# Patient Record
Sex: Male | Born: 1960 | Race: White | Hispanic: No | Smoking: Never smoker
Health system: Southern US, Community
[De-identification: ages and names within clinical notes are randomized; demographics above are authoritative.]

## PROBLEM LIST (undated history)

## (undated) DIAGNOSIS — K2210 Ulcer of esophagus without bleeding: Secondary | ICD-10-CM

## (undated) DIAGNOSIS — K5900 Constipation, unspecified: Secondary | ICD-10-CM

## (undated) DIAGNOSIS — K2970 Gastritis, unspecified, without bleeding: Secondary | ICD-10-CM

## (undated) DIAGNOSIS — E785 Hyperlipidemia, unspecified: Secondary | ICD-10-CM

## (undated) DIAGNOSIS — K5790 Diverticulosis of intestine, part unspecified, without perforation or abscess without bleeding: Secondary | ICD-10-CM

## (undated) DIAGNOSIS — K633 Ulcer of intestine: Secondary | ICD-10-CM

## (undated) DIAGNOSIS — K559 Vascular disorder of intestine, unspecified: Secondary | ICD-10-CM

## (undated) DIAGNOSIS — B9681 Helicobacter pylori [H. pylori] as the cause of diseases classified elsewhere: Secondary | ICD-10-CM

## (undated) DIAGNOSIS — I10 Essential (primary) hypertension: Secondary | ICD-10-CM

## (undated) DIAGNOSIS — T7840XA Allergy, unspecified, initial encounter: Secondary | ICD-10-CM

## (undated) DIAGNOSIS — K219 Gastro-esophageal reflux disease without esophagitis: Secondary | ICD-10-CM

## (undated) DIAGNOSIS — D126 Benign neoplasm of colon, unspecified: Secondary | ICD-10-CM

## (undated) HISTORY — DX: Hyperlipidemia, unspecified: E78.5

## (undated) HISTORY — DX: Gastritis, unspecified, without bleeding: K29.70

## (undated) HISTORY — DX: Allergy, unspecified, initial encounter: T78.40XA

## (undated) HISTORY — PX: NASAL SEPTUM SURGERY: SHX37

## (undated) HISTORY — DX: Essential (primary) hypertension: I10

## (undated) HISTORY — PX: COLONOSCOPY: SHX174

## (undated) HISTORY — DX: Diverticulosis of intestine, part unspecified, without perforation or abscess without bleeding: K57.90

## (undated) HISTORY — PX: LASIK: SHX215

## (undated) HISTORY — PX: POLYPECTOMY: SHX149

## (undated) HISTORY — PX: OTHER SURGICAL HISTORY: SHX169

## (undated) HISTORY — DX: Ulcer of intestine: K63.3

## (undated) HISTORY — DX: Ulcer of esophagus without bleeding: K22.10

## (undated) HISTORY — DX: Vascular disorder of intestine, unspecified: K55.9

## (undated) HISTORY — DX: Constipation, unspecified: K59.00

## (undated) HISTORY — DX: Gastro-esophageal reflux disease without esophagitis: K21.9

## (undated) HISTORY — DX: Benign neoplasm of colon, unspecified: D12.6

## (undated) HISTORY — DX: Helicobacter pylori (H. pylori) as the cause of diseases classified elsewhere: B96.81

## (undated) HISTORY — PX: UPPER GASTROINTESTINAL ENDOSCOPY: SHX188

---

## 2000-09-16 ENCOUNTER — Emergency Department (HOSPITAL_COMMUNITY): Admission: EM | Admit: 2000-09-16 | Discharge: 2000-09-17 | Payer: Self-pay | Admitting: Emergency Medicine

## 2000-09-17 ENCOUNTER — Encounter: Payer: Self-pay | Admitting: Emergency Medicine

## 2004-02-15 ENCOUNTER — Emergency Department (HOSPITAL_COMMUNITY): Admission: EM | Admit: 2004-02-15 | Discharge: 2004-02-16 | Payer: Self-pay | Admitting: Emergency Medicine

## 2009-01-04 ENCOUNTER — Emergency Department (HOSPITAL_BASED_OUTPATIENT_CLINIC_OR_DEPARTMENT_OTHER): Admission: EM | Admit: 2009-01-04 | Discharge: 2009-01-04 | Payer: Self-pay | Admitting: Emergency Medicine

## 2009-01-04 ENCOUNTER — Ambulatory Visit: Payer: Self-pay | Admitting: Interventional Radiology

## 2009-01-11 ENCOUNTER — Encounter (INDEPENDENT_AMBULATORY_CARE_PROVIDER_SITE_OTHER): Payer: Self-pay | Admitting: *Deleted

## 2009-01-11 ENCOUNTER — Encounter: Payer: Self-pay | Admitting: Gastroenterology

## 2009-02-05 DIAGNOSIS — D126 Benign neoplasm of colon, unspecified: Secondary | ICD-10-CM

## 2009-02-05 HISTORY — DX: Benign neoplasm of colon, unspecified: D12.6

## 2009-02-09 ENCOUNTER — Ambulatory Visit: Payer: Self-pay | Admitting: Gastroenterology

## 2009-02-09 ENCOUNTER — Encounter (INDEPENDENT_AMBULATORY_CARE_PROVIDER_SITE_OTHER): Payer: Self-pay | Admitting: *Deleted

## 2009-02-09 DIAGNOSIS — K5900 Constipation, unspecified: Secondary | ICD-10-CM | POA: Insufficient documentation

## 2009-02-09 DIAGNOSIS — R748 Abnormal levels of other serum enzymes: Secondary | ICD-10-CM | POA: Insufficient documentation

## 2009-02-09 DIAGNOSIS — R198 Other specified symptoms and signs involving the digestive system and abdomen: Secondary | ICD-10-CM

## 2009-02-09 DIAGNOSIS — R1012 Left upper quadrant pain: Secondary | ICD-10-CM

## 2009-02-09 DIAGNOSIS — R079 Chest pain, unspecified: Secondary | ICD-10-CM

## 2009-02-09 DIAGNOSIS — K219 Gastro-esophageal reflux disease without esophagitis: Secondary | ICD-10-CM

## 2009-02-09 LAB — CONVERTED CEMR LAB: Amylase: 84 units/L (ref 27–131)

## 2009-02-21 ENCOUNTER — Ambulatory Visit: Payer: Self-pay | Admitting: Gastroenterology

## 2009-02-24 ENCOUNTER — Encounter: Payer: Self-pay | Admitting: Gastroenterology

## 2009-03-29 ENCOUNTER — Ambulatory Visit: Payer: Self-pay | Admitting: Gastroenterology

## 2009-03-29 DIAGNOSIS — Z860100 Personal history of colon polyps, unspecified: Secondary | ICD-10-CM | POA: Insufficient documentation

## 2009-03-29 DIAGNOSIS — A048 Other specified bacterial intestinal infections: Secondary | ICD-10-CM | POA: Insufficient documentation

## 2009-12-21 ENCOUNTER — Ambulatory Visit (HOSPITAL_COMMUNITY): Admission: RE | Admit: 2009-12-21 | Discharge: 2009-12-21 | Payer: Self-pay | Admitting: General Surgery

## 2010-03-07 NOTE — Letter (Signed)
Summary: Patient Notice-Endo Biopsy Results  [facility]  [address] [place], [place] [address]   Phone: [phone]  Fax: [phone]        February 24, 2009 MRN: [id number]    Wesley Savage [address] [place], [place]  [address]    Dear [name],  I am pleased to inform you that the biopsies taken during your recent endoscopic examination did not show any evidence of cancer upon pathologic examination. The biopsies showed H. pylori gastritis. Please complete the antibiotic as prescribed.  Continue with the treatment plan as outlined on the day of your      exam.  Please call us if you are having persistent problems or have questions about your condition that have not been fully answered at this time.  Sincerely,  Meryl Dare MD [facility]  This letter has been electronically signed by your physician.  Appended Document: Patient Notice-Endo Biopsy Results Letter mailed 1.21.11

## 2010-03-07 NOTE — Letter (Signed)
Summary: Referral & Physician's Notes/[facility]  Referral & Physician's Notes/[facility]   Imported By: Sherian Rein 02/14/2009 08:13:39  _____________________________________________________________________  External Attachment:    Type:   Image     Comment:   External Document

## 2010-03-07 NOTE — Letter (Signed)
Summary: [facility] Instructions  [facility]  [address] [place], [place] [address]   Phone: [phone]  Fax: [phone]       RALLY OUCH    03-Mar-1960    MRN: [id number]        Procedure Day /Date: Monday January 17th, 2011     Arrival Time: 2:30pm     Procedure Time: 3:30pm     Location of Procedure:                    _ x_  [facility] (4th Floor)                        PREPARATION FOR COLONOSCOPY WITH MOVIPREP   Starting 5 days prior to your procedure 02/16/09 do not eat nuts, seeds, popcorn, corn, beans, peas,  salads, or any raw vegetables.  Do not take any fiber supplements (e.g. Metamucil, Citrucel, and Benefiber).  THE DAY BEFORE YOUR PROCEDURE         DATE:  02/20/09   DAY:  Sunday   1.  Drink clear liquids the entire day-NO SOLID FOOD  2.  Do not drink anything colored red or purple.  Avoid juices with pulp.  No orange juice.  3.  Drink at least 64 oz. (8 glasses) of fluid/clear liquids during the day to prevent dehydration and help the prep work efficiently.  CLEAR LIQUIDS INCLUDE: Water Jello Ice Popsicles Tea (sugar ok, no milk/cream) Powdered fruit flavored drinks Coffee (sugar ok, no milk/cream) Gatorade Juice: apple, white grape, white cranberry  Lemonade Clear bullion, consomm, broth Carbonated beverages (any kind) Strained chicken noodle soup Hard Candy                             4.  In the morning, mix first dose of MoviPrep solution:    Empty 1 Pouch A and 1 Pouch B into the disposable container    Add lukewarm drinking water to the top line of the container. Mix to dissolve    Refrigerate (mixed solution should be used within 24 hrs)  5.  Begin drinking the prep at 5:00 p.m. The MoviPrep container is divided by 4 marks.   Every 15 minutes drink the solution down to the next mark (approximately 8 oz) until the full liter is complete.   6.  Follow completed prep with 16 oz of clear liquid of your  choice (Nothing red or purple).  Continue to drink clear liquids until bedtime.  7.  Before going to bed, mix second dose of MoviPrep solution:    Empty 1 Pouch A and 1 Pouch B into the disposable container    Add lukewarm drinking water to the top line of the container. Mix to dissolve    Refrigerate  THE DAY OF YOUR PROCEDURE      DATE:  02/21/09  DAY:  Monday  Beginning at  10:30a.m. (5 hours before procedure):         1. Every 15 minutes, drink the solution down to the next mark (approx 8 oz) until the full liter is complete.  2. Follow completed prep with 16 oz. of clear liquid of your choice.    3. You may drink clear liquids until  1:30pm  (2 HOURS BEFORE PROCEDURE).   MEDICATION INSTRUCTIONS  Unless otherwise instructed, you should take regular prescription medications with a small sip of water  as early as possible the morning of your procedure.         OTHER INSTRUCTIONS  You will need a responsible adult at least 48 years of age to accompany you and drive you home.   This person must remain in the waiting room during your procedure.  Wear loose fitting clothing that is easily removed.  Leave jewelry and other valuables at home.  However, you may wish to bring a book to read or  an iPod/MP3 player to listen to music as you wait for your procedure to start.  Remove all body piercing jewelry and leave at home.  Total time from sign-in until discharge is approximately 2-3 hours.  You should go home directly after your procedure and rest.  You can resume normal activities the  day after your procedure.  The day of your procedure you should not:   Drive   Make legal decisions   Operate machinery   Drink alcohol   Return to work  You will receive specific instructions about eating, activities and medications before you leave.    The above instructions have been reviewed and explained to me by   Marchelle Folks.    I fully understand and can verbalize  these instructions _____________________________ Date _________

## 2010-03-07 NOTE — Miscellaneous (Signed)
Summary: omeprazole order  Clinical Lists Changes  Medications: Added new medication of OMEPRAZOLE 20 MG  CPDR (OMEPRAZOLE) 1 each day 30 minutes before meal - Signed Rx of OMEPRAZOLE 20 MG  CPDR (OMEPRAZOLE) 1 each day 30 minutes before meal;  #34 x 11;  Signed;  Entered by: Alease Frame RN;  Authorized by: Meryl Dare MD [facility];  Method used: Electronically to [facility] [phone]*, [address] [place], [place]  [address], Ph: [phone], Fax: [phone] Observations: Added new observation of ALLERGY REV: Done (02/22/2009 8:01)    Prescriptions: OMEPRAZOLE 20 MG  CPDR (OMEPRAZOLE) 1 each day 30 minutes before meal  #34 x 11   Entered by:   Alease Frame RN   Authorized by:   Meryl Dare MD [facility]   Signed by:   Alease Frame RN on 02/22/2009   Method used:   Electronically to        [facility] [phone]* (retail)       [address]       [place], [place]  [address]       Ph: [phone]       Fax: [phone]   RxID:   [id number]

## 2010-03-07 NOTE — Letter (Signed)
Summary: Patient Notice- Polyp Results  [facility]  [address] [place], [place] [address]   Phone: [phone]  Fax: [phone]        February 24, 2009 MRN: [id number]    Wesley Savage [address] [place], [place]  [address]    Dear Wesley Savage,  I am pleased to inform you that the colon polyp(s) removed during your recent colonoscopy was (were) found to be benign (no cancer detected) upon pathologic examination.  I recommend you have a repeat colonoscopy examination in 5 years to look for recurrent polyps, as having colon polyps increases your risk for having recurrent polyps or even colon cancer in the future.  Should you develop new or worsening symptoms of abdominal pain, bowel habit changes or bleeding from the rectum or bowels, please schedule an evaluation with either your primary care physician or with me.  Continue treatment plan as outlined the day of your exam.  Please call us if you are having persistent problems or have questions about your condition that have not been fully answered at this time.  Sincerely,  Meryl Dare MD [facility]  This letter has been electronically signed by your physician.  Appended Document: Patient Notice- Polyp Results Letter mailed 1.21.11

## 2010-03-07 NOTE — Assessment & Plan Note (Signed)
Summary: follow up endo/colon/sheri   History of Present Illness Visit Type: Follow-up Visit Primary GI MD: Elie Goody MD [facility] Primary Provider: Tracey Harries, MD  Requesting Provider: Tracey Harries, MD Chief Complaint: follow-up endo/colon  pt. denies any problems or complaints at this time History of Present Illness:   Wesley Savage has had very good control of his reflux symptoms on omeprazole once daily. He was treated for Helicobacter pylori gastritis. He has no gastrointestinal complaints. He denies chest pain. I reviewed the results of his endoscopy, colonoscopy and biopsies.   GI Review of Systems      Denies abdominal pain, acid reflux, belching, bloating, chest pain, dysphagia with liquids, dysphagia with solids, heartburn, loss of appetite, nausea, vomiting, vomiting blood, weight loss, and  weight gain.        Denies anal fissure, black tarry stools, change in bowel habit, constipation, diarrhea, diverticulosis, fecal incontinence, heme positive stool, hemorrhoids, irritable bowel syndrome, jaundice, light color stool, liver problems, rectal bleeding, and  rectal pain.   Current Medications (verified): 1)  Metoprolol Tartrate 50 Mg Tabs (Metoprolol Tartrate) .... One Tablet By Mouth Two Times A Day 2)  Aspirin 81 Mg  Tabs (Aspirin) .... One Tablet By Mouth Once Daily 3)  Losartan Potassium 50 Mg Tabs (Losartan Potassium) .... One Tablet By Mouth Once Daily 4)  Omeprazole 20 Mg  Cpdr (Omeprazole) .Marland Kitchen.. 1 Each Day 30 Minutes Before Meal  Allergies (verified): 1)  ! * Lisinopril  Past History:  Past Medical History: Diverticulitis Hyperlipidemia GERD Hypertension H. Pylori gastritis-treated, 02/2009 Adenomatous colon polyp, 02/2009  Past Surgical History: Left arm surgery  Deviated Septum Repair Lasik   Family History: Reviewed history from 02/09/2009 and no changes required. Mother: HTN, RA Brother: 3 brother w/ asthma Father: D @ 72. CAD MI @ 54. Prostate  CA @ 67. No FH of Colon Cancer: Family History of Colon Polyps:Mother  Family History of Kidney Disease:MGF  Social History: Reviewed history from 02/09/2009 and no changes required. Patient has never smoked.  Alcohol Use no Illicit Drug Use - no Occupation:Credit Manager  Divorced No children  Review of Systems       The pertinent positives and negatives are noted as above and in the HPI. All other ROS were reviewed and were negative.   Vital Signs:  Patient profile:   50 year old male Height:      70 inches Weight:      222.25 pounds BMI:     32.00 Pulse rate:   64 / minute Pulse rhythm:   regular BP sitting:   122 / 82  (left arm)  Vitals Entered By: Milford Cage NCMA (March 29, 2009 9:29 AM)  Physical Exam  General:  Well developed, well nourished, no acute distress. Head:  Normocephalic and atraumatic. Eyes:  PERRLA, no icterus. Mouth:  No deformity or lesions, dentition normal. Lungs:  Clear throughout to auscultation. Heart:  Regular rate and rhythm; no murmurs, rubs,  or bruits. Abdomen:  Soft, nontender and nondistended. No masses, hepatosplenomegaly or hernias noted. Normal bowel sounds. Psych:  Alert and cooperative. Normal mood and affect.  Impression & Recommendations:  Problem # 1:  GERD (ICD-530.81) Check continue standard antireflux measures and omeprazole 20 mg q.a.m. After another 2 months of treatment, he may try omeprazole on an as-needed basis.  Problem # 2:  HELICOBACTER PYLORI INFECTION (ICD-041.86) Recently treated Helicobacter pylori gastritis.  Problem # 3:  PERSONAL HX COLONIC POLYPS (ICD-V12.72) Adenomatous colon polyps. Surveillance colonoscopy  recommended January 2016.  Patient Instructions: 1)  Avoid foods high in acid content ( tomatoes, citrus juices, spicy foods) . Avoid eating within 3 to 4 hours of lying down or before exercising. Do not over eat; try smaller more frequent meals. Elevate head of bed four inches when  sleeping.  2)  Please schedule a follow-up appointment as needed.  3)  Copy sent to : Tracey Harries, MD 4)  The medication list was reviewed and reconciled.  All changed / newly prescribed medications were explained.  A complete medication list was provided to the patient / caregiver.

## 2010-03-07 NOTE — Procedures (Signed)
Summary: Upper Endoscopy  Patient: Wesley Savage Note: All result statuses are Final unless otherwise noted.  Tests: (1) Upper Endoscopy (EGD)   EGD Upper Endoscopy       DONE     [facility]     [address]     [place], [place]  [address]           ENDOSCOPY PROCEDURE REPORT           PATIENT:  Wesley Savage, Wesley Savage  MR#:  [id number]     BIRTHDATE:  Nov 11, 1960, 48 yrs. old  GENDER:  male           ENDOSCOPIST:  Judie Petit T. Russella Dar, MD, [facility]     Referred by:  Tracey Harries, M.D.           PROCEDURE DATE:  02/21/2009     PROCEDURE:  EGD with biopsy     ASA CLASS:  Class II     INDICATIONS:  abdominal pain, left upper quadr, GERD, chest pain                 MEDICATIONS:  There was residual sedation effect present from     prior procedure, Fentanyl 25 mcg IV, Versed 3 mg IV     TOPICAL ANESTHETIC:  Exactacain Spray           DESCRIPTION OF PROCEDURE:   After the risks benefits and     alternatives of the procedure were thoroughly explained, informed     consent was obtained.  The [facility] [id number] endoscope was     introduced through the mouth and advanced to the second portion of     the duodenum, without limitations.  The instrument was slowly     withdrawn as the mucosa was fully examined.     <<PROCEDUREIMAGES>>           Esophagitis was found in the distal esophagus. It was erosive. LA     Classification Grade A. Mild gastritis was found in the antrum. It     was erosive and erythematous. Multiple biopsies were obtained and     sent to pathology. Duodenitis was found in the bulb of the     duodenum. It was erosive and erythematous. Multiple biopsies were     obtained and sent to pathology.  Otherwise the examination was     normal. Retroflexed views revealed no abnormalities. The scope was     then withdrawn from the patient and the procedure completed.           COMPLICATIONS:  None           ENDOSCOPIC IMPRESSION:     1) Erosive esophagitis     2) Mild  gastritis in the antrum     3) Duodenitis in the bulb of duodenum           RECOMMENDATIONS:     1) await pathology results     2) anti-reflux regimen     3) PPI qam, omeprazole 20mg  po qam, #34, 11 refills     4) Call office next 2-3 days to schedule an office appointment     for 4 weeks           Suetta Hoffmeister T. Russella Dar, MD, [facility]           n.     eSIGNED:   Venita Lick. Salina Stanfield at 02/21/2009 04:05 PM           Marisa Severin, [id number]  Note: An exclamation mark (!) indicates a result that was not dispersed into the flowsheet. Document Creation Date: 02/21/2009 4:05 PM _______________________________________________________________________  (1) Order result status: Final Collection or observation date-time: 02/21/2009 16:00 Requested date-time:  Receipt date-time:  Reported date-time:  Referring Physician:   Ordering Physician: Claudette Head [phone]) Specimen Source:  Source: Launa Grill Order Number: [id number] Lab site:

## 2010-03-07 NOTE — Procedures (Signed)
Summary: Colonoscopy  Patient: Wesley Savage Note: All result statuses are Final unless otherwise noted.  Tests: (1) Colonoscopy (COL)   COL Colonoscopy           DONE     [facility]     [address]     [place], [place]  [address]           COLONOSCOPY PROCEDURE REPORT           PATIENT:  Wesley Savage, Wesley Savage  MR#:  [id number]     BIRTHDATE:  Feb 18, 1960, 48 yrs. old  GENDER:  male           ENDOSCOPIST:  Judie Petit T. Russella Dar, MD, [facility]     Referred by:  Tracey Harries, M.D.           PROCEDURE DATE:  02/21/2009     PROCEDURE:  Colonoscopy with biopsy     ASA CLASS:  Class II     INDICATIONS:  1) change in bowel habits           MEDICATIONS:   Fentanyl 75 mcg IV, Versed 7 mg IV           DESCRIPTION OF PROCEDURE:   After the risks benefits and     alternatives of the procedure were thoroughly explained, informed     consent was obtained.  Digital rectal exam was performed and     revealed no abnormalities.   The [facility] [id number] endoscope     was introduced through the anus and advanced to the cecum, which     was identified by both the appendix and ileocecal valve, without     limitations.  The quality of the prep was excellent, using     MoviPrep.  The instrument was then slowly withdrawn as the colon     was fully examined.     <<PROCEDUREIMAGES>>           FINDINGS:  A sessile polyp was found in the ascending colon. It     was 4 mm in size. The polyp was removed using cold biopsy forceps.     Mild diverticulosis was found in the descending colon. This was     otherwise a normal examination of the colon.  Retroflexed views in     the rectum revealed no abnormalities.  The time to cecum =  3     minutes. The scope was then withdrawn (time =  9  min) from the     patient and the procedure completed.           COMPLICATIONS:  None           ENDOSCOPIC IMPRESSION:     1) 4 mm sessile polyp in the ascending colon     2) Mild diverticulosis in the descending  colon           RECOMMENDATIONS:     1) Await pathology results     2) If the polyp removed today is adenomatous (pre-cancerous),     you will need a repeat colonoscopy in 5 years. Otherwise you     should continue to follow colorectal cancer screening guidelines     for "routine risk" patients with colonoscopy in 10 years.    3)     high fiber diet           Malcolm T. Russella Dar, MD, [facility]           n.     eSIGNED:  Venita Lick. Stark at 02/21/2009 03:56 PM           Marisa Severin, [id number]  Note: An exclamation mark (!) indicates a result that was not dispersed into the flowsheet. Document Creation Date: 02/21/2009 3:56 PM _______________________________________________________________________  (1) Order result status: Final Collection or observation date-time: 02/21/2009 15:51 Requested date-time:  Receipt date-time:  Reported date-time:  Referring Physician:   Ordering Physician: Claudette Head [phone]) Specimen Source:  Source: Launa Grill Order Number: [id number] Lab site:   Appended Document: Colonoscopy     Procedures Next Due Date:    Colonoscopy: 02/2014

## 2010-03-07 NOTE — Miscellaneous (Signed)
Summary: omeprazole order  Clinical Lists Changes  Medications: Added new medication of OMEPRAZOLE 20 MG  CPDR (OMEPRAZOLE) 1 each day 30 minutes before meal

## 2010-03-07 NOTE — Assessment & Plan Note (Signed)
Summary: CONSTIPATION...AS.   History of Present Illness Visit Type: consult  Primary GI MD: Elie Goody MD [facility] Primary Provider: Tracey Harries, MD  Requesting Provider: Tracey Harries, MD Chief Complaint: constipation, acid reflux, chest pain, and heartburn  History of Present Illness:   This is a 50 year old male, who has multiple complaints. He noted a change in bowel habits with thinner and smaller stools and difficulty evacuating stools followed by occasional diarrhea beginning in late November. He also noted burning substernal chest pain in late November, which responded to a course of Prilosec.  He has had intermittent sharp left, center chest pains, and left costochondral margin pain that appears to be unrelated to any digestive or other activity. These symptoms have occurred intermittently and unpredictably for the past several weeks. He was seen in [facility] emergency room on November 30. Plain films of the chest and abdomen were unremarkable. CT scan of the abdomen and pelvis with contrast showed a nonspecific right adrenal nodule and no other abnormalities. CBC was normal. Basic metabolic panel normal lipase mildly elevated at 413 with an upper limit of normal of 300.   GI Review of Systems    Reports abdominal pain, acid reflux, chest pain, heartburn, and  loss of appetite.     Location of  Abdominal pain: LUQ.    Denies belching, bloating, dysphagia with liquids, dysphagia with solids, nausea, vomiting, vomiting blood, weight loss, and  weight gain.      Reports constipation.     Denies anal fissure, black tarry stools, change in bowel habit, diarrhea, diverticulosis, fecal incontinence, heme positive stool, hemorrhoids, irritable bowel syndrome, jaundice, light color stool, liver problems, rectal bleeding, and  rectal pain.   Current Medications (verified): 1)  Metoprolol Tartrate 50 Mg Tabs (Metoprolol Tartrate) .... One Tablet By Mouth Two Times A Day 2)   Aspirin 81 Mg  Tabs (Aspirin) .... One Tablet By Mouth Once Daily 3)  Losartan Potassium 50 Mg Tabs (Losartan Potassium) .... One Tablet By Mouth Once Daily  Allergies (verified): 1)  ! * Lisinopril  Past History:  Past Medical History: Reviewed history from 02/08/2009 and no changes required. Diverticulitis Hyperlipidemia GERD Hypertension  Past Surgical History: Left arm surgery   Family History: Mother: HTN, RA Brother: 3 brother w/ asthma Father: D @ 84. CAD MI @ 54. Prostate CA @ 67. No FH of Colon Cancer: Family History of Colon Polyps:Mother  Family History of Kidney Disease:MGF  Social History: Patient has never smoked.  Alcohol Use no Illicit Drug Use - no Occupation:Credit Manager  Divorced No childern  Review of Systems       The patient complains of back pain, change in vision, fatigue, headaches-new, sleeping problems, and urination - excessive.         The pertinent positives and negatives are noted as above and in the HPI. All other ROS were reviewed and were negative.   Vital Signs:  Patient profile:   50 year old male Height:      70 inches Weight:      219 pounds BMI:     31.54 BSA:     2.17 Pulse rate:   72 / minute Pulse rhythm:   regular BP sitting:   128 / 82  (left arm) Cuff size:   regular  Vitals Entered By: Ok Anis CMA (February 09, 2009 9:59 AM)  Physical Exam  General:  Well developed, well nourished, no acute distress. Head:  Normocephalic and atraumatic.  Eyes:  PERRLA, no icterus. Ears:  Normal auditory acuity. Mouth:  No deformity or lesions, dentition normal. Neck:  Supple; no masses or thyromegaly. Chest Wall:  Symmetrical,  no deformities . no tendersness along sternum or left costochondral margin Lungs:  Clear throughout to auscultation. Heart:  Regular rate and rhythm; no murmurs, rubs,  or bruits. Abdomen:  Soft, nontender and nondistended. No masses, hepatosplenomegaly or hernias noted. Normal bowel  sounds. Rectal:  deferred until time of colonoscopy.   Msk:  Symmetrical with no gross deformities. Normal posture. Pulses:  Normal pulses noted. Extremities:  No clubbing, cyanosis, edema or deformities noted. Neurologic:  Alert and  oriented x4;  grossly normal neurologically. Cervical Nodes:  No significant cervical adenopathy. Inguinal Nodes:  No significant inguinal adenopathy. Psych:  Alert and cooperative. Normal mood and affect.  Impression & Recommendations:  Problem # 1:  CHANGE IN BOWELS (ICD-787.99) Initially with constipation, now with alternating diarrhea and constipation Rule out colorectal neoplasms and IBD. The risks, benefits and alternatives to colonoscopy with possible biopsy and possible polypectomy were discussed with the patient and they consent to proceed. The procedure will be scheduled electively. Orders: Colon/Endo (Colon/Endo)  Problem # 2:  OTHER NONSPECIFIC ABNORMAL SERUM ENZYME LEVELS (ICD-790.5) Nonspecific elevation of lipase without symptoms consistent with pancreatitis for any CT abnormalities of the pancreas. As his likely nonspecific. Obtain lipase and amylase. Orders: TLB-Amylase (82150-AMYL) TLB-Lipase (83690-LIPASE)  Problem # 3:  GERD (ICD-530.81) This may be the etiology of his chest pain, however, features of the pain are more typical of a nongastrointestinal cause. The risks, benefits and alternatives to endoscopy with possible biopsy and possible dilation were discussed with the patient and they consent to proceed. The procedure will be scheduled electively. Orders: Colon/Endo (Colon/Endo)  Problem # 4:  ABDOMINAL PAIN, LEFT UPPER QUADRANT (ICD-789.02) EGD as above. Orders: Colon/Endo (Colon/Endo)  Problem # 5:  CHEST PAIN (ICD-786.50) EGD as above.  Patient Instructions: 1)  Colonoscopy brochure given.  2)  Conscious Sedation brochure given.  3)  Upper Endoscopy brochure given.  4)  Avoid foods high in acid content ( tomatoes,  citrus juices, spicy foods) . Avoid eating within 3 to 4 hours of lying down or before exercising. Do not over eat; try smaller more frequent meals. Elevate head of bed four inches when sleeping.  5)  High Fiber, Low Fat  Healthy Eating Plan brochure given.  6)  Copy sent to : Tracey Harries, MD 7)  The medication list was reviewed and reconciled.  All changed / newly prescribed medications were explained.  A complete medication list was provided to the patient / caregiver.  Prescriptions: MOVIPREP 100 GM  SOLR (PEG-KCL-NACL-NASULF-NA ASC-C) As per prep instructions.  #1 x 0   Entered by:   Christie Nottingham CMA (AAMA)   Authorized by:   Meryl Dare MD [facility]   Signed by:   Meryl Dare MD [facility] on 02/09/2009   Method used:   Electronically to        [facility] [phone] (retail)       [address]       [place], [place]  [address]       Ph: [phone]       Fax: [phone]   RxID:   [id number]

## 2010-04-18 LAB — SURGICAL PCR SCREEN: MRSA, PCR: NEGATIVE

## 2010-04-18 LAB — BASIC METABOLIC PANEL
BUN: 23 mg/dL (ref 6–23)
CO2: 29 mEq/L (ref 19–32)
Chloride: 103 mEq/L (ref 96–112)
Creatinine, Ser: 1.17 mg/dL (ref 0.4–1.5)
GFR calc Af Amer: >60 mL/min (ref >60)
Potassium: 4.4 mEq/L (ref 3.5–5.1)

## 2010-05-10 LAB — DIFFERENTIAL
Basophils Relative: 0 % (ref 0–1)
Eosinophils Absolute: 0.1 10*3/uL (ref 0.0–0.7)
Eosinophils Relative: 1 % (ref 0–5)
Lymphs Abs: 1.6 10*3/uL (ref 0.7–4.0)
Monocytes Absolute: 0.7 10*3/uL (ref 0.1–1.0)
Monocytes Relative: 9 % (ref 3–12)

## 2010-05-10 LAB — LIPASE, BLOOD: Lipase: 413 U/L — ABNORMAL HIGH (ref 23–300)

## 2010-05-10 LAB — CBC
HCT: 45.4 % (ref 39.0–52.0)
Hemoglobin: 16 g/dL (ref 13.0–17.0)
MCHC: 35.2 g/dL (ref 30.0–36.0)
MCV: 88.9 fL (ref 78.0–100.0)
RBC: 5.11 MIL/uL (ref 4.22–5.81)
WBC: 8.1 10*3/uL (ref 4.0–10.5)

## 2010-05-10 LAB — BASIC METABOLIC PANEL
CO2: 27 mEq/L (ref 19–32)
Chloride: 101 mEq/L (ref 96–112)
GFR calc Af Amer: >60 mL/min (ref >60)
Potassium: 4.1 mEq/L (ref 3.5–5.1)

## 2014-02-08 ENCOUNTER — Telehealth: Payer: Self-pay | Admitting: Gastroenterology

## 2014-02-08 NOTE — Telephone Encounter (Signed)
Patient with constipation,  He is very concerned about taking Miralax that was recommended by his primary care.  He will come in and [employer] Hvozdovic, PA on 02/10/14 1:15

## 2014-02-10 ENCOUNTER — Encounter: Payer: Self-pay | Admitting: Physician Assistant

## 2014-02-10 ENCOUNTER — Other Ambulatory Visit (INDEPENDENT_AMBULATORY_CARE_PROVIDER_SITE_OTHER): Payer: BLUE CROSS/BLUE SHIELD

## 2014-02-10 ENCOUNTER — Ambulatory Visit (INDEPENDENT_AMBULATORY_CARE_PROVIDER_SITE_OTHER): Payer: BLUE CROSS/BLUE SHIELD | Admitting: Physician Assistant

## 2014-02-10 VITALS — BP 110/80 | HR 64 | Ht 70.0 in | Wt 215.0 lb

## 2014-02-10 DIAGNOSIS — R194 Change in bowel habit: Secondary | ICD-10-CM

## 2014-02-10 DIAGNOSIS — K5900 Constipation, unspecified: Secondary | ICD-10-CM

## 2014-02-10 DIAGNOSIS — Z86010 Personal history of colonic polyps: Secondary | ICD-10-CM

## 2014-02-10 LAB — TSH: TSH: 1.54 u[IU]/mL (ref 0.35–4.50)

## 2014-02-10 MED ORDER — PEG-KCL-NACL-NASULF-NA ASC-C 100 G PO SOLR
1.0000 | Freq: Once | ORAL | Status: DC
Start: 2014-02-10 — End: 2014-02-23

## 2014-02-10 NOTE — Patient Instructions (Signed)
The PA has requested that you go to the basement for the following lab work before leaving today:TSH.  Cecille Rubin recommends that you complete a bowel purge (to clean out your bowels). Please do the following: Purchase a bottle of Miralax over the counter as well as a box of 5 mg dulcolax tablets. Take 4 dulcolax tablets. Wait 1 hour. You will then drink 6-8 capfuls of Miralax mixed in an adequate amount of water/juice/gatorade (you may choose which of these liquids to drink) over the next 2-3 hours. You should expect results within 1 to 6 hours after completing the bowel purge.  You have been scheduled for a colonoscopy. Please follow written instructions given to you at your visit today.  Please pick up your prep kit at the pharmacy within the next 1-3 days. If you use inhalers (even only as needed), please bring them with you on the day of your procedure.  Start over the [employer] one heaping teaspoon daily.  Increase your water intake and decrease your caffeine intake.   Start eating "p" fruits such as peaches, pears, prunes, plums, pineapple and pear juice.   cc: Bernerd Limbo, MD

## 2014-02-10 NOTE — Progress Notes (Signed)
Patient ID: Wesley Savage, male   DOB: February 18, 1960, 54 y.o.   MRN: [id number]    HPI:  Wesley Savage is a 54 year old male referred for evaluation by Dr. Bartholomew Crews, do to a recent change in bowel habits.    Wesley Savage says that up until Thanksgiving, he was having a normal bowel movement on a daily basis. Around Thanksgiving he had 3 days of very large formed bowel movements, and then skipped 3 days without a bowel movement at all. Since that time, he has been skipping days between bowel movements and on the days that he does have a bowel movement, he passes only small nuggets like rabbit pellets. He has been having left lower abdominal and suprapubic crampy abdominal pain relieved with defecation, and he feels more gassy and bloated than normal. He was seen by his primary care physician on December 22 told he was constipated he was advised to use Mira lax which provided little relief he went back to his doctor's office last week and was sent for an abdominal CT that revealed constipation and some left sided diverticular disease but no diverticulitis. He tried using more Mira lax but failed to have a bowel movement. He has had no bright red blood per rectum or melena. His appetite is good but he has lost 10 pounds in the past month.  Review of his chart also shows that he had a colonoscopy by Dr. Fuller Plan in January 2011 at which time an adenomatous polyp was removed. He was advised to have surveillance in 5 years. He is interested in scheduling that at this time as well.  Past Medical History  Diagnosis Date  . Erosive esophagitis   . Helicobacter pylori gastritis   . Diverticulosis   . Tubular adenoma of colon 2011  . Hyperlipidemia   . GERD (gastroesophageal reflux disease)   . Hypertension   . Constipation     Past Surgical History  Procedure Laterality Date  . Arm surgery    . Nasal septum surgery    . Lasik     Family History  Problem Relation Age of Onset  . Hypertension Mother   .  Asthma Brother   . CAD Father   . Heart attack Father   . Prostate cancer Father   . Colon polyps Mother   . Kidney disease Maternal Grandfather    History  Substance Use Topics  . Smoking status: Never Smoker   . Smokeless tobacco: Never Used  . Alcohol Use: No   Current Outpatient Prescriptions  Medication Sig Dispense Refill  . aspirin 81 MG tablet Take 81 mg by mouth daily.    Marland Kitchen losartan (COZAAR) 50 MG tablet Take 50 mg by mouth daily.    . metoprolol (LOPRESSOR) 50 MG tablet Take 50 mg by mouth 2 (two) times daily.    . Omega-3 Fatty Acids (FISH OIL) 1200 MG CAPS Take 1 capsule by mouth 2 (two) times daily.    . tamsulosin (FLOMAX) 0.4 MG CAPS capsule Take 0.4 mg by mouth daily.    Marland Kitchen loratadine (CLARITIN) 10 MG tablet Take 10 mg by mouth daily.    . predniSONE (DELTASONE) 10 MG tablet 10 mg. Take as directed     No current facility-administered medications for this visit.   Allergies  Allergen Reactions  . Lisinopril     REACTION: Cough     Review of Systems: Gen: Denies any fever, chills, sweats, anorexia, fatigue, weakness, malaise, weight loss, and sleep disorder CV: Denies chest  pain, angina, palpitations, syncope, orthopnea, PND, peripheral edema, and claudication. Resp: Denies dyspnea at rest, dyspnea with exercise, cough, sputum, wheezing, coughing up blood, and pleurisy. GI: Denies vomiting blood, jaundice, and fecal incontinence.   Denies dysphagia or odynophagia. GU : Denies urinary burning, blood in urine, urinary frequency, urinary hesitancy, nocturnal urination, and urinary incontinence. MS: Denies joint pain, limitation of movement, and swelling, stiffness, low back pain, extremity pain. Denies muscle weakness, cramps, atrophy.  Derm: Denies rash, itching, dry skin, hives, moles, warts, or unhealing ulcers.  Psych: Denies depression, anxiety, memory loss, suicidal ideation, hallucinations, paranoia, and confusion. Heme: Denies bruising, bleeding, and  enlarged lymph nodes. Neuro:  Denies any headaches, dizziness, paresthesias. Endo:  Denies any problems with DM, thyroid, adrenal function  Studies:  CT of the abdomen and pelvis done on 02/03/2014 [facility] showed diverticulosis without diverticulitis. Hypodense lesion in the left kidney measures 6 x 9 mm, at site of abnormality on prior noncontrast exam. MRI with and without contrast could be considered for further evaluation, however may be difficult to accurately define due to small size. Unchanged right adrenal nodule consistent with adenoma. Probable sludge in the gallbladder.   Prior Endoscopies:    Colonoscopy 02/21/2009:  ENDOSCOPIC IMPRESSION:  1) 4 mm sessile polyp in the ascending colon  2) Mild diverticulosis in the descending colon    RECOMMENDATIONS:  1) Await pathology results  2) If the polyp removed today is adenomatous (pre-cancerous),  you will need a repeat colonoscopy in 5 years. Otherwise you  should continue to follow colorectal cancer screening guidelines  for "routine risk" patients with colonoscopy in 10 years. 3)  high fiber diet    Malcolm T. Fuller Plan, MD, [facility]  EGD 02/21/2009:  ENDOSCOPIC IMPRESSION:  1) Erosive esophagitis  2) Mild gastritis in the antrum  3) Duodenitis in the bulb of duodenum    RECOMMENDATIONS:  1) await pathology results  2) anti-reflux regimen  3) PPI qam, omeprazole 20mg  po qam, #34, 11 refills  4) Call office next 2-3 days to schedule an office appointment  for 4 weeks    Malcolm T. Fuller Plan, MD, [facility] Physical Exam: BP 110/80 mmHg  Pulse 64  Ht 5\' 10"  (1.778 m)  Wt 215 lb (97.523 kg)  BMI 30.85 kg/m2 Constitutional: Pleasant,well-developed male in no acute distress. HEENT: Normocephalic and atraumatic. Conjunctivae are normal. No scleral icterus. Neck supple.  Cardiovascular: Normal rate, regular rhythm.  Pulmonary/chest:  Effort normal and breath sounds normal. No wheezing, rales or rhonchi. Abdominal: Soft, nondistended, nontender. Bowel sounds active throughout. There are no masses palpable. No hepatomegaly. Extremities: no edema Lymphadenopathy: No cervical adenopathy noted. Neurological: Alert and oriented to person place and time. Skin: Skin is warm and dry. No rashes noted. Psychiatric: Normal mood and affect. Behavior is normal.  ASSESSMENT AND PLAN: #1. Change in bowel habits/constipation. Patient has been advised to use a Mira lax and Gatorade purge at home tonight. He has been advised to increase fiber in his diet and to use Benefiber 1 heaping tablespoon daily. He will also add "P fruits"-peaches, pears, plums, prunes, pineapple, NP or juice to his diet on a daily basis. He will decrease caffeine intake and increase his water intake. He has been instructed to call us if he fails to have any relief in a few days. A TSH will also be checked today.  #2. Personal history of adenomatous colon polyps. Patient has been advised to undergo colonoscopy to evaluate for recurrent polyps or neoplasia.The risks, benefits,  and alternatives to colonoscopy with possible biopsy and possible polypectomy were discussed with the patient and they consent to proceed. The procedure will be scheduled with Dr. Fuller Plan. Further recommendations will be made pending the findings of his colonoscopy.    Jennavecia Schwier, Vita Barley PA-C 02/10/2014, 2:31 PM

## 2014-02-10 NOTE — Progress Notes (Signed)
Reviewed and agree with management plan.  Malcolm T. Stark, MD FACG 

## 2014-02-12 ENCOUNTER — Telehealth: Payer: Self-pay | Admitting: Physician Assistant

## 2014-02-12 NOTE — Telephone Encounter (Signed)
Patient notified of recommendations. 

## 2014-02-12 NOTE — Telephone Encounter (Signed)
Please have pt try bisacodyl 4 tabs today and a fleets enema.

## 2014-02-12 NOTE — Telephone Encounter (Signed)
Patient reports he did the bowel purge. He still has left side discomfort. States he did the purge on Wednesday and had 4 liquid only stools. Yesterday, he had one liquid stool and no stools today. Please, advise.

## 2014-02-12 NOTE — Telephone Encounter (Signed)
Spoke with Cecille Rubin Hvozdovic, PA-C patient may try Magnesium Citrate 1 bottle. Spoke with patient and gave him recommendation.

## 2014-02-15 ENCOUNTER — Encounter: Payer: Self-pay | Admitting: Physician Assistant

## 2014-02-15 ENCOUNTER — Telehealth: Payer: Self-pay | Admitting: Physician Assistant

## 2014-02-15 NOTE — Telephone Encounter (Signed)
Spoke with patient and he states after he took the Magnesium Citrate, he had several liquid stools. He had another liquid stool on Saturday. Today, he is having discomfort on left side under rib cage. He has had 2 small stools today that were soft. He is taking the Benefiber daily. Please, advise.

## 2014-02-16 ENCOUNTER — Encounter: Payer: Self-pay | Admitting: Gastroenterology

## 2014-02-16 NOTE — Telephone Encounter (Signed)
Patient given recommendations. He reports he had a bowel movement today. Reports it was larger than yesterday's.

## 2014-02-16 NOTE — Telephone Encounter (Signed)
Glycerin suppository. Warm water enema, 6 oz x 2. Benefiber 1 heaping tbsp bid.

## 2014-02-16 NOTE — Telephone Encounter (Signed)
Left a message for patient to call back. 

## 2014-02-23 ENCOUNTER — Encounter: Payer: Self-pay | Admitting: Gastroenterology

## 2014-02-23 ENCOUNTER — Ambulatory Visit (AMBULATORY_SURGERY_CENTER): Payer: BLUE CROSS/BLUE SHIELD | Admitting: Gastroenterology

## 2014-02-23 ENCOUNTER — Encounter: Payer: BLUE CROSS/BLUE SHIELD | Admitting: Gastroenterology

## 2014-02-23 VITALS — BP 127/72 | HR 77 | Temp 97.6°F | Resp 16 | Ht 70.0 in | Wt 215.0 lb

## 2014-02-23 DIAGNOSIS — Z86010 Personal history of colonic polyps: Secondary | ICD-10-CM

## 2014-02-23 DIAGNOSIS — D128 Benign neoplasm of rectum: Secondary | ICD-10-CM

## 2014-02-23 DIAGNOSIS — K635 Polyp of colon: Secondary | ICD-10-CM

## 2014-02-23 DIAGNOSIS — D123 Benign neoplasm of transverse colon: Secondary | ICD-10-CM

## 2014-02-23 DIAGNOSIS — K639 Disease of intestine, unspecified: Secondary | ICD-10-CM

## 2014-02-23 DIAGNOSIS — R1084 Generalized abdominal pain: Secondary | ICD-10-CM

## 2014-02-23 DIAGNOSIS — R194 Change in bowel habit: Secondary | ICD-10-CM

## 2014-02-23 MED ORDER — SODIUM CHLORIDE 0.9 % IV SOLN: 500.0000 mL | INTRAVENOUS | Status: DC

## 2014-02-23 MED ORDER — CIPROFLOXACIN HCL 500 MG PO TABS: 500.0000 mg | ORAL_TABLET | Freq: Two times a day (BID) | ORAL | Status: DC

## 2014-02-23 NOTE — Progress Notes (Signed)
Called to room to assist during endoscopic procedure.  Patient ID and intended procedure confirmed with present staff. Received instructions for my participation in the procedure from the performing physician.  

## 2014-02-23 NOTE — Op Note (Signed)
[place]  [address] [place], [address]   COLONOSCOPY PROCEDURE REPORT PATIENT: Wesley, Savage  MR#: [id number] BIRTHDATE: [birth year] , 53  yrs. old GENDER: male ENDOSCOPIST: Ladene Artist, MD, [facility] PROCEDURE DATE:  02/23/2014 PROCEDURE:   Colonoscopy with biopsy and Colonoscopy with snare polypectomy First Screening Colonoscopy - Avg.  risk and is 50 yrs.  old or older - No.  Prior Negative Screening - Now for repeat screening. N/A  History of Adenoma - Now for follow-up colonoscopy & has been > or = to 3 yrs.  Yes hx of adenoma.  Has been 3 or more years since last colonoscopy.  Polyps Removed Today? Yes. ASA CLASS:   Class II INDICATIONS:surveillance colonoscopy based on a history of adenomatous colonic polyp(s), change in bowel habits, and abdominal pain. MEDICATIONS: Monitored anesthesia care and Propofol 250 mg IV DESCRIPTION OF PROCEDURE:   After the risks benefits and alternatives of the procedure were thoroughly explained, informed consent was obtained.  The digital rectal exam revealed no abnormalities of the rectum.   The LB [id number]  endoscope was introduced through the anus and advanced to the cecum, which was identified by both the appendix and ileocecal valve. No adverse events experienced.   The quality of the prep was good, using MoviPrep  The instrument was then slowly withdrawn as the colon was fully examined.  COLON FINDINGS: A sessile polyp measuring 5 mm in size was found in the proximal transverse colon.  A polypectomy was performed with a cold snare.  The resection was complete, the polyp tissue was completely retrieved and sent to histology.   A 3 x 5cm patch of abnormal mucosa was found in the distal transverse colon.  The mucosa was friable, erythematous and had erosions.  Less than 50% of the surface was affected.  Multiple biopsies of the area were performed.   There was mild diverticulosis noted in the left  colon. The examination was otherwise normal.  Retroflexed views revealed no abnormalities. The time to cecum=3 minutes 17 seconds. Withdrawal time=13 minutes 31 seconds.  The scope was withdrawn and the procedure completed. COMPLICATIONS: There were no immediate complications.  ENDOSCOPIC IMPRESSION: 1.   Sessile polyp in the proximal transverse colon; polypectomy performed with a cold snare 2.  Abnormal mucosa in the distal transverse colon; multiple biopsies performed 3.   Mild diverticulosis in the left colon  RECOMMENDATIONS: 1.  Await pathology results 2.  Repeat Colonoscopy in 5 years 3.  Out patient follow-up in 2-3 weeks 4.  Cipro 500 mg po bid, #20  eSigned:  Ladene Artist, MD, [facility] 02/23/2014 2:14 PM   cc: Bernerd Limbo, MD

## 2014-02-23 NOTE — Patient Instructions (Addendum)

## 2014-02-23 NOTE — Progress Notes (Signed)
Procedure ends, to recovery, report given and VSS. 

## 2014-02-24 ENCOUNTER — Telehealth: Payer: Self-pay | Admitting: *Deleted

## 2014-02-24 NOTE — Telephone Encounter (Signed)
  Follow up Call-  Call back number 02/23/2014  Post procedure Call Back phone  # [phone] cell  Permission to leave phone message Yes     Patient questions:  Do you have a fever, pain , or abdominal swelling? No. Pain Score  0 *  Have you tolerated food without any problems? Yes.    Have you been able to return to your normal activities? Yes.    Do you have any questions about your discharge instructions: Diet   No. Medications  No. Follow up visit  No.  Do you have questions or concerns about your Care? No.  Actions: * If pain score is 4 or above: No action needed, pain <4.

## 2014-03-03 ENCOUNTER — Encounter: Payer: Self-pay | Admitting: Gastroenterology

## 2014-03-09 ENCOUNTER — Telehealth: Payer: Self-pay | Admitting: Gastroenterology

## 2014-03-09 NOTE — Telephone Encounter (Signed)
I reviewed the results with the patient.  All questions answered.  He did not receive a copy of the letter in the mail.

## 2014-03-19 ENCOUNTER — Ambulatory Visit (INDEPENDENT_AMBULATORY_CARE_PROVIDER_SITE_OTHER): Payer: BLUE CROSS/BLUE SHIELD | Admitting: Gastroenterology

## 2014-03-19 ENCOUNTER — Encounter: Payer: Self-pay | Admitting: Gastroenterology

## 2014-03-19 VITALS — BP 136/74 | HR 64 | Ht 70.0 in | Wt 213.5 lb

## 2014-03-19 DIAGNOSIS — R1084 Generalized abdominal pain: Secondary | ICD-10-CM

## 2014-03-19 DIAGNOSIS — K5730 Diverticulosis of large intestine without perforation or abscess without bleeding: Secondary | ICD-10-CM

## 2014-03-19 DIAGNOSIS — K5900 Constipation, unspecified: Secondary | ICD-10-CM

## 2014-03-19 NOTE — Progress Notes (Signed)
History of Present Illness: This is a 54 year old male returning for follow-up of constipation and abdominal pain. Recent colonoscopy showed diverticulosis with focal inflammatory changes. Pathology report-likely ischemic or NSAID induced. Taking daily MiraLAX has not relieved his constipation.He has thin small stools and a sense of incomplete evacuation associated with abdominal discomfort across his lower abdomen intermittently and across his left flank intermittently. He has not resumed Benefiber as was previously recommended. He was treated with a course of Cipro for possible focal diverticulitis but did not note a change in symptoms after completing Cipro.  Allergies  Allergen Reactions  . Lisinopril     REACTION: Cough  . Pravastatin Hives   Outpatient Prescriptions Prior to Visit  Medication Sig Dispense Refill  . aspirin 81 MG tablet Take 81 mg by mouth daily.    Marland Kitchen losartan (COZAAR) 50 MG tablet Take 50 mg by mouth daily.    . metoprolol (LOPRESSOR) 50 MG tablet Take 50 mg by mouth 2 (two) times daily.    . Omega-3 Fatty Acids (FISH OIL) 1200 MG CAPS Take 1 capsule by mouth 2 (two) times daily.    . ciprofloxacin (CIPRO) 500 MG tablet Take 1 tablet (500 mg total) by mouth 2 (two) times daily. 20 tablet 0  . loratadine (CLARITIN) 10 MG tablet Take 10 mg by mouth daily.    . tamsulosin (FLOMAX) 0.4 MG CAPS capsule Take 0.4 mg by mouth daily.     No facility-administered medications prior to visit.   Past Medical History  Diagnosis Date  . Erosive esophagitis   . Helicobacter pylori gastritis   . Diverticulosis   . Tubular adenoma of colon 2011  . Hyperlipidemia   . GERD (gastroesophageal reflux disease)   . Hypertension   . Constipation   . Allergy   . Colonic ulcer   . Ischemia of large intestine    Past Surgical History  Procedure Laterality Date  . Arm surgery    . Nasal septum surgery    . Lasik    . Colonoscopy    . Upper gastrointestinal endoscopy    .  Polypectomy     History   Social History  . Marital Status: Divorced    Spouse Name: N/A  . Number of Children: N/A  . Years of Education: N/A   Social History Main Topics  . Smoking status: Never Smoker   . Smokeless tobacco: Never Used  . Alcohol Use: No  . Drug Use: No  . Sexual Activity: Not on file   Other Topics Concern  . None   Social History Narrative   Family History  Problem Relation Age of Onset  . Hypertension Mother   . Colon polyps Mother   . Asthma Brother   . CAD Father   . Heart attack Father   . Prostate cancer Father   . Kidney disease Maternal Grandfather   . Colon cancer Neg Hx   . Esophageal cancer Neg Hx   . Rectal cancer Neg Hx   . Stomach cancer Neg Hx     Physical Exam: General: Well developed , well nourished, no acute distress Head: Normocephalic and atraumatic Eyes:  sclerae anicteric, EOMI Ears: Normal auditory acuity Mouth: No deformity or lesions Lungs: Clear throughout to auscultation Heart: Regular rate and rhythm; no murmurs, rubs or bruits Abdomen: Soft, mild lower abdominal tenderness to deep palpation and non distended. No masses, hepatosplenomegaly or hernias noted. Normal Bowel sounds Musculoskeletal: Symmetrical with no gross deformities  Pulses:  Normal pulses noted Extremities: No clubbing, cyanosis, edema or deformities noted Neurological: Alert oriented x 4, grossly nonfocal Psychological:  Alert and cooperative. Normal mood and affect  Assessment and Recommendations:  1. Constipation, incomplete evacuation associated with mild abdominal pain in multiple sites. High fiber diet, adequate water intake, Benefiber 1 or 2 tablespoons daily titrated for adequate results. MiraLAX once or twice daily titrated for adequate results.   2. Diverticulosis with focal inflammatory changes favoring ischemia or NSAID injury. He is advised to minimize the use of NSAIDs. This could be SCAD. High fiber diet, adequate water intake and  fiber supplements as above.  3. Personal history of adenomatous colon polyps. Five-year surveillance colonoscopy recommended in January 2021.  Over 50% of the office visit was spent counseling the patient on optimal long-term management of constipation and diverticulosis.

## 2014-03-19 NOTE — Patient Instructions (Addendum)
Please use one-two doses of Miralax daily, self adjust as needed.  Please take benefiber , handout provided.  Work up to 2 tablespoons daily and self adjust as needed.  We are giving you a handout on high fiber diet to read and follow.   I appreciate the opportunity to care for you. Lucio Edward, M.D., [facility]   CC: Bernerd Limbo M.D.

## 2014-06-10 ENCOUNTER — Other Ambulatory Visit (HOSPITAL_COMMUNITY): Payer: Self-pay | Admitting: Family Medicine

## 2014-06-10 DIAGNOSIS — E785 Hyperlipidemia, unspecified: Secondary | ICD-10-CM

## 2014-06-10 DIAGNOSIS — Z8249 Family history of ischemic heart disease and other diseases of the circulatory system: Secondary | ICD-10-CM

## 2014-07-01 ENCOUNTER — Telehealth (HOSPITAL_COMMUNITY): Payer: Self-pay

## 2014-07-01 NOTE — Telephone Encounter (Signed)
Encounter complete. 

## 2014-07-06 ENCOUNTER — Ambulatory Visit (HOSPITAL_COMMUNITY)
Admission: RE | Admit: 2014-07-06 | Discharge: 2014-07-06 | Disposition: A | Discharge status: Home / Self Care | Payer: BLUE CROSS/BLUE SHIELD | Source: Ambulatory Visit | Attending: Cardiology | Admitting: Cardiology

## 2014-07-06 DIAGNOSIS — Z8249 Family history of ischemic heart disease and other diseases of the circulatory system: Secondary | ICD-10-CM | POA: Diagnosis not present

## 2014-07-06 DIAGNOSIS — E785 Hyperlipidemia, unspecified: Secondary | ICD-10-CM | POA: Diagnosis not present

## 2014-07-06 LAB — EXERCISE TOLERANCE TEST
Estimated workload: 13.4 METS
Exercise duration (min): 11 min
Exercise duration (sec): 0 s
MPHR: 169 {beats}/min
Peak HR: 166 {beats}/min
Percent HR: 98 %
RPE: 26726
Rest HR: 86 {beats}/min

## 2014-12-06 ENCOUNTER — Encounter (HOSPITAL_COMMUNITY): Payer: Self-pay | Admitting: Emergency Medicine

## 2014-12-06 ENCOUNTER — Emergency Department (HOSPITAL_COMMUNITY)
Admission: EM | Admit: 2014-12-06 | Discharge: 2014-12-06 | Disposition: A | Discharge status: Home / Self Care | Payer: BLUE CROSS/BLUE SHIELD | Attending: Emergency Medicine | Admitting: Emergency Medicine

## 2014-12-06 ENCOUNTER — Emergency Department (HOSPITAL_COMMUNITY): Payer: BLUE CROSS/BLUE SHIELD

## 2014-12-06 ENCOUNTER — Other Ambulatory Visit: Payer: Self-pay

## 2014-12-06 DIAGNOSIS — Z8639 Personal history of other endocrine, nutritional and metabolic disease: Secondary | ICD-10-CM | POA: Diagnosis not present

## 2014-12-06 DIAGNOSIS — R0789 Other chest pain: Secondary | ICD-10-CM | POA: Insufficient documentation

## 2014-12-06 DIAGNOSIS — Z7982 Long term (current) use of aspirin: Secondary | ICD-10-CM | POA: Insufficient documentation

## 2014-12-06 DIAGNOSIS — K219 Gastro-esophageal reflux disease without esophagitis: Secondary | ICD-10-CM | POA: Insufficient documentation

## 2014-12-06 DIAGNOSIS — I10 Essential (primary) hypertension: Secondary | ICD-10-CM | POA: Diagnosis not present

## 2014-12-06 DIAGNOSIS — R079 Chest pain, unspecified: Secondary | ICD-10-CM | POA: Diagnosis present

## 2014-12-06 LAB — CBC
HEMATOCRIT: 45.4 % (ref 39.0–52.0)
Hemoglobin: 15.4 g/dL (ref 13.0–17.0)
MCH: 30.3 pg (ref 26.0–34.0)
MCHC: 33.9 g/dL (ref 30.0–36.0)
MCV: 89.4 fL (ref 78.0–100.0)
PLATELETS: 140 10*3/uL — AB (ref 150–400)
RBC: 5.08 MIL/uL (ref 4.22–5.81)
RDW: 13.3 % (ref 11.5–15.5)
WBC: 6.7 10*3/uL (ref 4.0–10.5)

## 2014-12-06 LAB — BASIC METABOLIC PANEL
Anion gap: 7 (ref 5–15)
BUN: 19 mg/dL (ref 6–20)
CHLORIDE: 105 mmol/L (ref 101–111)
CO2: 29 mmol/L (ref 22–32)
Calcium: 9.1 mg/dL (ref 8.9–10.3)
Creatinine, Ser: 1.31 mg/dL — ABNORMAL HIGH (ref 0.61–1.24)
GFR calc Af Amer: >60 mL/min (ref >60)
GFR calc non Af Amer: >60 mL/min (ref >60)
GLUCOSE: 115 mg/dL — AB (ref 65–99)
Potassium: 4.6 mmol/L (ref 3.5–5.1)
SODIUM: 141 mmol/L (ref 135–145)

## 2014-12-06 LAB — I-STAT TROPONIN, ED: Troponin i, poc: 0 ng/mL (ref 0.00–0.08)

## 2014-12-06 NOTE — ED Notes (Signed)
Pt placed in a gown and hooked up to the monitor with a 5 lead, BP cuff and pulse ox 

## 2014-12-06 NOTE — ED Notes (Signed)
C/o discomfort to center of chest and L chest x 2 weeks.  Reports intermittent weakness in L shoulder and pain in L forearm.  Seen by PCP this week and started meds for acid reflux without relief.  Denies nausea/vomiting/sob.

## 2014-12-06 NOTE — ED Provider Notes (Signed)
CSN: [id number]     Arrival date & time 12/06/14  0329 History   First MD Initiated Contact with Patient 12/06/14 [phone]     Chief Complaint  Patient presents with  . Chest Pain     (Consider location/radiation/quality/duration/timing/severity/associated sxs/prior Treatment) Patient is a 54 y.o. male presenting with chest pain. The history is provided by the patient.  Chest Pain Pain location:  Substernal area Pain quality: sharp   Pain radiates to:  Does not radiate Pain radiates to the back: no   Pain severity:  Mild Onset quality:  Gradual Duration:  2 weeks Timing:  Intermittent Progression:  Waxing and waning Chronicity:  Recurrent Context: raising an arm and at rest   Context comment:  Non-exertional Relieved by:  Nothing Worsened by:  Nothing tried Ineffective treatments: meloxicam. Associated symptoms: no abdominal pain, no diaphoresis, no fever, no lower extremity edema, no nausea, no shortness of breath and not vomiting   Risk factors: hypertension   Risk factors: no diabetes mellitus, no high cholesterol and no smoking     Past Medical History  Diagnosis Date  . Erosive esophagitis   . Helicobacter pylori gastritis   . Diverticulosis   . Tubular adenoma of colon 2011  . Hyperlipidemia   . GERD (gastroesophageal reflux disease)   . Hypertension   . Constipation   . Allergy   . Colonic ulcer   . Ischemia of large intestine [facility])    Past Surgical History  Procedure Laterality Date  . Arm surgery    . Nasal septum surgery    . Lasik    . Colonoscopy    . Upper gastrointestinal endoscopy    . Polypectomy     Family History  Problem Relation Age of Onset  . Hypertension Mother   . Colon polyps Mother   . Asthma Brother   . CAD Father   . Heart attack Father   . Prostate cancer Father   . Kidney disease Maternal Grandfather   . Colon cancer Neg Hx   . Esophageal cancer Neg Hx   . Rectal cancer Neg Hx   . Stomach cancer Neg Hx    Social History   Substance Use Topics  . Smoking status: Never Smoker   . Smokeless tobacco: Never Used  . Alcohol Use: No    Review of Systems  Constitutional: Negative for fever and diaphoresis.  Respiratory: Negative for shortness of breath.   Cardiovascular: Positive for chest pain.  Gastrointestinal: Negative for nausea, vomiting and abdominal pain.  All other systems reviewed and are negative.     Allergies  Lisinopril and Pravastatin  Home Medications   Prior to Admission medications   Medication Sig Start Date End Date Taking? Authorizing Provider  aspirin 81 MG tablet Take 81 mg by mouth daily.   Yes Historical Provider, MD  losartan (COZAAR) 50 MG tablet Take 100 mg by mouth daily.    Yes Historical Provider, MD  lubiprostone (AMITIZA) 24 MCG capsule Take 24 mcg by mouth 2 (two) times daily with a meal.   Yes Historical Provider, MD  metoprolol (LOPRESSOR) 50 MG tablet Take 50 mg by mouth 2 (two) times daily.   Yes Historical Provider, MD  Omega-3 Fatty Acids (FISH OIL) 1200 MG CAPS Take 1 capsule by mouth 2 (two) times daily.   Yes Historical Provider, MD   BP 133/84 mmHg  Pulse 67  Temp(Src) 98.5 F (36.9 C) (Oral)  Resp 17  Ht 5\' 11"  (1.803 m)  Wt  214 lb (97.07 kg)  BMI 29.86 kg/m2  SpO2 100% Physical Exam  Constitutional: He is oriented to person, place, and time. He appears well-developed and well-nourished. No distress.  HENT:  Head: Normocephalic and atraumatic.  Eyes: Conjunctivae are normal.  Neck: Neck supple. No tracheal deviation present.  Cardiovascular: Normal rate, regular rhythm and normal heart sounds.  Exam reveals no gallop and no friction rub.   No murmur heard. Pulmonary/Chest: Effort normal. He has no wheezes. He has no rales. He exhibits tenderness (left upper chest at sternal border).  Abdominal: Soft. He exhibits no distension. There is no tenderness.  Neurological: He is alert and oriented to person, place, and time.  Skin: Skin is warm and dry.   Psychiatric: He has a normal mood and affect.    ED Course  Procedures (including critical care time) Labs Review Labs Reviewed  BASIC METABOLIC PANEL - Abnormal; Notable for the following:    Glucose, Bld 115 (*)    Creatinine, Ser 1.31 (*)    All other components within normal limits  CBC - Abnormal; Notable for the following:    Platelets 140 (*)    All other components within normal limits  I-STAT TROPOININ, ED    Imaging Review Dg Chest 2 View  12/06/2014  CLINICAL DATA:  Left-sided chest pain for 17 days. EXAM: CHEST  2 VIEW COMPARISON:  12/19/2009 FINDINGS: A pulmonary nodule is simulated by a benign sclerotic focus associated with the anterior left sixth rib end, unchanged. The lungs are clear. There is no pleural effusion. Hilar, mediastinal and cardiac contours are unremarkable and unchanged. Pulmonary vasculature is normal. IMPRESSION: No active cardiopulmonary disease. Electronically Signed   By: Andreas Newport M.D.   On: 12/06/2014 05:57   I have personally reviewed and evaluated these images and lab results as part of my medical decision-making.   EKG Interpretation   Date/Time:  Monday December 06 2014 03:39:43 EDT Ventricular Rate:  70 PR Interval:  176 QRS Duration: 100 QT Interval:  394 QTC Calculation: 425 R Axis:   67 Text Interpretation:  Normal sinus rhythm Normal ECG Confirmed by Kent Riendeau  MD, Viriginia Amendola ([id number]) on 12/06/2014 7:39:31 AM      MDM   Final diagnoses:  Chest wall pain    54 year old male presents with noncardiac appearing chest pain on his left upper sternal border for the last 2 weeks intermittently. Having pain here currently that is reproducible to palpation. Heart score is 2. Troponin is negative. EKG is without any significant abnormalities. Doubt ACS with atypical symptoms. Plan to follow up with PCP as needed and return precautions discussed for worsening or new concerning symptoms.     Leo Grosser, MD 12/06/14 [phone]

## 2014-12-06 NOTE — Discharge Instructions (Signed)

## 2014-12-06 NOTE — ED Notes (Signed)
Pt transported to xray 

## 2015-03-30 ENCOUNTER — Other Ambulatory Visit: Payer: Self-pay | Admitting: Gastroenterology

## 2015-03-31 ENCOUNTER — Encounter (HOSPITAL_COMMUNITY): Payer: Self-pay | Admitting: *Deleted

## 2015-04-07 NOTE — H&P (Signed)
  Wesley Savage HPI: During a recent EGD to evaluation for intermittent LUQ pain an antral submucosal lesion was identified.  Past Medical History  Diagnosis Date  . Erosive esophagitis   . Helicobacter pylori gastritis   . Diverticulosis   . Tubular adenoma of colon 2011  . Hyperlipidemia   . GERD (gastroesophageal reflux disease)   . Hypertension   . Constipation   . Allergy   . Colonic ulcer   . Ischemia of large intestine [facility])     Past Surgical History  Procedure Laterality Date  . Arm surgery    . Nasal septum surgery    . Lasik    . Colonoscopy    . Upper gastrointestinal endoscopy    . Polypectomy      Family History  Problem Relation Age of Onset  . Hypertension Mother   . Colon polyps Mother   . Asthma Brother   . CAD Father   . Heart attack Father   . Prostate cancer Father   . Kidney disease Maternal Grandfather   . Colon cancer Neg Hx   . Esophageal cancer Neg Hx   . Rectal cancer Neg Hx   . Stomach cancer Neg Hx     Social History:  reports that he has never smoked. He has never used smokeless tobacco. He reports that he does not drink alcohol or use illicit drugs.  Allergies:  Allergies  Allergen Reactions  . Lisinopril Other (See Comments)    REACTION: Cough  . Pravastatin Hives    Medications: Scheduled: Continuous:  No results found for this or any previous visit (from the past 24 hour(s)).   No results found.  ROS:  As stated above in the HPI otherwise negative.  There were no vitals taken for this visit.    PE: Gen: NAD, Alert and Oriented HEENT:  [place]/AT, EOMI Neck: Supple, no LAD Lungs: CTA Bilaterally CV: RRR without M/G/R ABM: Soft, NTND, +BS Ext: No C/C/E  Assessment/Plan: 1) Gastric submucosal lesion - EUS with FNA.  Darbi Chandran D 04/07/2015, 5:16 PM

## 2015-04-08 ENCOUNTER — Ambulatory Visit (HOSPITAL_COMMUNITY): Payer: BLUE CROSS/BLUE SHIELD | Admitting: Anesthesiology

## 2015-04-08 ENCOUNTER — Encounter (HOSPITAL_COMMUNITY): Payer: Self-pay

## 2015-04-08 ENCOUNTER — Ambulatory Visit (HOSPITAL_COMMUNITY)
Admission: RE | Admit: 2015-04-08 | Discharge: 2015-04-08 | Disposition: A | Discharge status: Home / Self Care | Payer: BLUE CROSS/BLUE SHIELD | Source: Ambulatory Visit | Attending: Gastroenterology | Admitting: Gastroenterology

## 2015-04-08 ENCOUNTER — Encounter (HOSPITAL_COMMUNITY)
Admission: RE | Disposition: A | Discharge status: Home / Self Care | Payer: Self-pay | Source: Ambulatory Visit | Attending: Gastroenterology

## 2015-04-08 DIAGNOSIS — E785 Hyperlipidemia, unspecified: Secondary | ICD-10-CM | POA: Insufficient documentation

## 2015-04-08 DIAGNOSIS — C49A2 Gastrointestinal stromal tumor of stomach: Secondary | ICD-10-CM | POA: Insufficient documentation

## 2015-04-08 DIAGNOSIS — Z8711 Personal history of peptic ulcer disease: Secondary | ICD-10-CM | POA: Insufficient documentation

## 2015-04-08 DIAGNOSIS — I10 Essential (primary) hypertension: Secondary | ICD-10-CM | POA: Diagnosis not present

## 2015-04-08 DIAGNOSIS — K319 Disease of stomach and duodenum, unspecified: Secondary | ICD-10-CM | POA: Diagnosis present

## 2015-04-08 DIAGNOSIS — K219 Gastro-esophageal reflux disease without esophagitis: Secondary | ICD-10-CM | POA: Diagnosis not present

## 2015-04-08 HISTORY — PX: FINE NEEDLE ASPIRATION: SHX5430

## 2015-04-08 HISTORY — PX: EUS: SHX5427

## 2015-04-08 SURGERY — ESOPHAGEAL ENDOSCOPIC ULTRASOUND (EUS) RADIAL
Anesthesia: Monitor Anesthesia Care

## 2015-04-08 MED ORDER — LIDOCAINE HCL (CARDIAC) 20 MG/ML IV SOLN
INTRAVENOUS | Status: AC
  Filled 2015-04-08: qty 5

## 2015-04-08 MED ORDER — PROPOFOL 500 MG/50ML IV EMUL
INTRAVENOUS | Status: DC | PRN
  Administered 2015-04-08: 130 ug/kg/min via INTRAVENOUS

## 2015-04-08 MED ORDER — SODIUM CHLORIDE 0.9 % IV SOLN: INTRAVENOUS | Status: DC

## 2015-04-08 MED ORDER — PROPOFOL 10 MG/ML IV BOLUS
INTRAVENOUS | Status: AC
Start: 2015-04-08 — End: 2015-04-08
  Filled 2015-04-08: qty 40

## 2015-04-08 MED ORDER — PROPOFOL 10 MG/ML IV BOLUS
INTRAVENOUS | Status: DC | PRN
  Administered 2015-04-08 (×2): 20 mg via INTRAVENOUS

## 2015-04-08 MED ORDER — LIDOCAINE HCL (CARDIAC) 20 MG/ML IV SOLN
INTRAVENOUS | Status: DC | PRN
  Administered 2015-04-08: 100 mg via INTRAVENOUS

## 2015-04-08 MED ORDER — LACTATED RINGERS IV SOLN
INTRAVENOUS | Status: DC
  Administered 2015-04-08: 1000 mL via INTRAVENOUS

## 2015-04-08 MED ORDER — PROPOFOL 10 MG/ML IV BOLUS
INTRAVENOUS | Status: AC
  Filled 2015-04-08: qty 20

## 2015-04-08 NOTE — Discharge Instructions (Signed)
Moderate Conscious Sedation, Adult, Care After Refer to this sheet in the next few weeks. These instructions provide you with information on caring for yourself after your procedure. Your health care provider may also give you more specific instructions. Your treatment has been planned according to current medical practices, but problems sometimes occur. Call your health care provider if you have any problems or questions after your procedure. WHAT TO EXPECT AFTER THE PROCEDURE  After your procedure:  You may feel sleepy, clumsy, and have poor balance for several hours.  Vomiting may occur if you eat too soon after the procedure. HOME CARE INSTRUCTIONS  Do not participate in any activities where you could become injured for at least 24 hours. Do not:  Drive.  Swim.  Ride a bicycle.  Operate heavy machinery.  Cook.  Use power tools.  Climb ladders.  Work from a high place.  Do not make important decisions or sign legal documents until you are improved.  If you vomit, drink water, juice, or soup when you can drink without vomiting. Make sure you have little or no nausea before eating solid foods.  Only take over-the-counter or prescription medicines for pain, discomfort, or fever as directed by your health care provider.  Make sure you and your family fully understand everything about the medicines given to you, including what side effects may occur.  You should not drink alcohol, take sleeping pills, or take medicines that cause drowsiness for at least 24 hours.  If you smoke, do not smoke without supervision.  If you are feeling better, you may resume normal activities 24 hours after you were sedated.  Keep all appointments with your health care provider. SEEK MEDICAL CARE IF:  Your skin is pale or bluish in color.  You continue to feel nauseous or vomit.  Your pain is getting worse and is not helped by medicine.  You have bleeding or swelling.  You are still  sleepy or feeling clumsy after 24 hours. SEEK IMMEDIATE MEDICAL CARE IF:  You develop a rash.  You have difficulty breathing.  You develop any type of allergic problem.  You have a fever. MAKE SURE YOU:  Understand these instructions.  Will watch your condition.  Will get help right away if you are not doing well or get worse.   This information is not intended to replace advice given to you by your health care provider. Make sure you discuss any questions you have with your health care provider.   Document Released: 11/12/2012 Document Revised: 02/12/2014 Document Reviewed: 11/12/2012 Elsevier Interactive Patient Education 2016 [place].   Esophagogastroduodenoscopy, Care After Refer to this sheet in the next few weeks. These instructions provide you with information about caring for yourself after your procedure. Your health care provider may also give you more specific instructions. Your treatment has been planned according to current medical practices, but problems sometimes occur. Call your health care provider if you have any problems or questions after your procedure. WHAT TO EXPECT AFTER THE PROCEDURE After your procedure, it is typical to feel:  Soreness in your throat.  Pain with swallowing.  Sick to your stomach (nauseous).  Bloated.  Dizzy.  Fatigued. HOME CARE INSTRUCTIONS  Do not eat or drink anything until the numbing medicine (local anesthetic) has worn off and your gag reflex has returned. You will know that the local anesthetic has worn off when you can swallow comfortably.  Do not drive or operate machinery until directed by your health care provider.  Take medicines only as directed by your health care provider. SEEK MEDICAL CARE IF:   You cannot stop coughing.  You are not urinating at all or less than usual. SEEK IMMEDIATE MEDICAL CARE IF:  You have difficulty swallowing.  You cannot eat or drink.  You have worsening throat or chest  pain.  You have dizziness or lightheadedness or you faint.  You have nausea or vomiting.  You have chills.  You have a fever.  You have severe abdominal pain.  You have black, tarry, or bloody stools.   This information is not intended to replace advice given to you by your health care provider. Make sure you discuss any questions you have with your health care provider.   Document Released: 01/09/2012 Document Revised: 02/12/2014 Document Reviewed: 01/09/2012 Elsevier Interactive Patient Education [employer].

## 2015-04-08 NOTE — Anesthesia Postprocedure Evaluation (Signed)
Anesthesia Post Note  Patient: Wesley Savage  Procedure(s) Performed: Procedure(s) (LRB): ESOPHAGEAL ENDOSCOPIC ULTRASOUND (EUS) RADIAL (N/A) FINE NEEDLE ASPIRATION (FNA) LINEAR (N/A)  Patient location during evaluation: Endoscopy Anesthesia Type: MAC Level of consciousness: awake and alert Pain management: pain level controlled Vital Signs Assessment: post-procedure vital signs reviewed and stable Respiratory status: spontaneous breathing, nonlabored ventilation, respiratory function stable and patient connected to nasal cannula oxygen Cardiovascular status: stable and blood pressure returned to baseline Anesthetic complications: no    Last Vitals:  Filed Vitals:   04/08/15 1055 04/08/15 1214  BP: 155/89 88/48  Pulse: 64 52  Temp: 36.8 C 36.6 C  Resp: 19 12    Last Pain: There were no vitals filed for this visit.               Catalina Gravel

## 2015-04-08 NOTE — Op Note (Signed)
[facility] [place], [address]   UPPER ENDOSCOPIC ULTRASOUND PROCEDURE REPORT     EXAM DATE: 04/08/2015  PATIENT NAME:          Wesley Savage, Wesley Savage          MR#: [id number] BIRTHDATE:       1960-05-11     VISIT #:     [phone] ATTENDING:     Carol Ada, MD     STATUS:     outpatient ASSISTANT:      Elspeth Cho and Job Founds MD: ASA CLASS:        Class II  INDICATIONS:  The patient is a 55 yr old male here for a lower endoscopic ultrasound due to gastric submucosal mass PROCEDURE PERFORMED:     Upper EUS w/FNA  MEDICATIONS:     Monitored anesthesia care  CONSENT: The patient understands the risks and benefits of the procedure and understands that these risks include, but are not limited to: sedation, allergic reaction, infection, perforation and/or bleeding. Alternative means of evaluation and treatment include, among others: physical exam, x-rays, and/or surgical intervention. The patient elects to proceed with this endoscopic procedure.  DESCRIPTION OF PROCEDURE: During intra-op preparation period all mechanical & medical equipment was checked for proper function. Hand hygiene and appropriate measures for infection prevention was taken. After the risks, benefits and alternatives of the procedure were thoroughly explained, Informed consent was verified, confirmed and timeout was successfully executed by the treatment team. The patient was then placed in the left, lateral, decubitus position and IV sedation was administered. Throughout the procedure, the patients blood pressure, pulse and oxygen saturations were monitored continuously. Under direct visualization, the    was introduced through the mouth and advanced to the bulb of duodenum. Water was used as necessary to provide an acoustic interface. The pulse, BP, and O2 saturation were monitored and documented by the physician and nursing staff throughout  the procedure. Upon completion of the imaging, water was removed and the patient was then discharged to recovery in stable condition with the appropriate post procedure care. Estimated blood loss is zero unless otherwise noted in this procedure report.  FINDINGS: In the proximal portion of the the gastric antrum/distal greater curvature portion of the gastric body on the posterior side an 18 x 12 mm submucosal lesion was identified (Images 004, 014, 006, and 008).  The lesion was well-circumscribed and hypoechoic. I was not able to discern the layer of origin.  Three passes with the 22 gauge FNA needle were performed and excellent samples were obtained.  No other abnormalities were noted in the upper abdomen with EUS.    ADVERSE EVENTS:     There were no immediate complications.  IMPRESSIONS:     1) 18 x 12 mm submucosal lesion s/p FNA.   RECOMMENDATIONS:     1) Await FNA results.  ___________________________________ Carol Ada, MD eSigned:  Carol Ada, MD 04/08/2015 12:27 PM   cc:      PATIENT NAME:  Jourdan, Cerullo MR#: [id number]

## 2015-04-08 NOTE — Anesthesia Preprocedure Evaluation (Addendum)
Anesthesia Evaluation  Patient identified by MRN, date of birth, ID band Patient awake    Reviewed: Allergy & Precautions, NPO status , Patient's Chart, lab work & pertinent test results, reviewed documented beta blocker date and time   Airway Mallampati: II  TM Distance: >3 FB Neck ROM: Full    Dental  (+) Teeth Intact, Dental Advisory Given   Pulmonary neg pulmonary ROS,    Pulmonary exam normal breath sounds clear to auscultation       Cardiovascular hypertension, Pt. on medications and Pt. on home beta blockers Normal cardiovascular exam Rhythm:Regular Rate:Normal  07/06/14 Stress test:  ECG Baseline ECG is normal.  There was no ST segment deviation noted during stress.  Arrhythmias during stress: none.  Arrhythmias during recovery: none.  There were no significant arrhythmias noted during the test.  ECG was interpretable and conclusive.   Stress Findings The patient exercised following the Bruce protocol.   The test was stopped because the patient complained of fatigue.   The patient reported fatigue during the stress test. The patient experienced no angina during the stress test.   Blood pressure and heart rate demonstrated a normal response to exercise. Heart rate demonstrated normal response to exercise. Overall, the patient's exercise capacity was excellent.   85% of maximun heart rate was achieved after 8 minutes. Recovery time: 11 minutes.  Duke Treadmill Score: low risk The patient's response to exercise was adequate for diagnosis. Patient requested to stopped treadmill due to leg fatigue.     Neuro/Psych negative neurological ROS  negative psych ROS   GI/Hepatic Neg liver ROS, PUD, GERD  Medicated,Erosive esophagitis    Endo/Other  negative endocrine ROS  Renal/GU negative Renal ROS     Musculoskeletal negative musculoskeletal ROS (+)   Abdominal   Peds  Hematology negative hematology  ROS (+)   Anesthesia Other Findings Day of surgery medications reviewed with the patient.  Reproductive/Obstetrics                           Anesthesia Physical Anesthesia Plan  ASA: II  Anesthesia Plan: MAC   Post-op Pain Management:    Induction: Intravenous  Airway Management Planned: Nasal Cannula and Natural Airway  Additional Equipment:   Intra-op Plan:   Post-operative Plan:   Informed Consent: I have reviewed the patients History and Physical, chart, labs and discussed the procedure including the risks, benefits and alternatives for the proposed anesthesia with the patient or authorized representative who has indicated his/her understanding and acceptance.   Dental advisory given  Plan Discussed with: CRNA and Anesthesiologist  Anesthesia Plan Comments: (Discussed risks/benefits/alternatives to MAC sedation including need for ventilatory support, hypotension, need for conversion to general anesthesia.  All patient questions answered.  Patient wished to proceed.)       Anesthesia Quick Evaluation

## 2015-04-08 NOTE — Transfer of Care (Signed)
Immediate Anesthesia Transfer of Care Note  Patient: Wesley Savage  Procedure(s) Performed: Procedure(s): ESOPHAGEAL ENDOSCOPIC ULTRASOUND (EUS) RADIAL (N/A) FINE NEEDLE ASPIRATION (FNA) LINEAR (N/A)  Patient Location: PACU and Endoscopy Unit  Anesthesia Type:MAC  Level of Consciousness: awake, alert , oriented and patient cooperative  Airway & Oxygen Therapy: Patient Spontanous Breathing and Patient connected to nasal cannula oxygen  Post-op Assessment: Report given to RN, Post -op Vital signs reviewed and stable and Patient moving all extremities  Post vital signs: Reviewed and stable  Last Vitals:  Filed Vitals:   04/08/15 1055  BP: 155/89  Pulse: 64  Temp: 36.8 C  Resp: 19    Complications: No apparent anesthesia complications

## 2015-04-11 ENCOUNTER — Encounter (HOSPITAL_COMMUNITY): Payer: Self-pay | Admitting: Gastroenterology

## 2015-05-09 ENCOUNTER — Other Ambulatory Visit: Payer: Self-pay | Admitting: General Surgery

## 2015-05-18 ENCOUNTER — Encounter (HOSPITAL_COMMUNITY): Payer: Self-pay

## 2015-05-18 ENCOUNTER — Encounter (HOSPITAL_COMMUNITY)
Admission: RE | Admit: 2015-05-18 | Discharge: 2015-05-18 | Disposition: A | Discharge status: Home / Self Care | Payer: BLUE CROSS/BLUE SHIELD | Source: Ambulatory Visit | Attending: General Surgery | Admitting: General Surgery

## 2015-05-18 DIAGNOSIS — Z01812 Encounter for preprocedural laboratory examination: Secondary | ICD-10-CM | POA: Diagnosis present

## 2015-05-18 DIAGNOSIS — K319 Disease of stomach and duodenum, unspecified: Secondary | ICD-10-CM | POA: Insufficient documentation

## 2015-05-18 LAB — CBC
HCT: 45.9 % (ref 39.0–52.0)
Hemoglobin: 15.5 g/dL (ref 13.0–17.0)
MCH: 29.9 pg (ref 26.0–34.0)
MCHC: 33.8 g/dL (ref 30.0–36.0)
MCV: 88.6 fL (ref 78.0–100.0)
PLATELETS: 151 10*3/uL (ref 150–400)
RBC: 5.18 MIL/uL (ref 4.22–5.81)
RDW: 13.5 % (ref 11.5–15.5)
WBC: 7 10*3/uL (ref 4.0–10.5)

## 2015-05-18 LAB — BASIC METABOLIC PANEL
Anion gap: 10 (ref 5–15)
BUN: 12 mg/dL (ref 6–20)
CHLORIDE: 102 mmol/L (ref 101–111)
CO2: 27 mmol/L (ref 22–32)
CREATININE: 1.25 mg/dL — AB (ref 0.61–1.24)
Calcium: 9.1 mg/dL (ref 8.9–10.3)
GFR calc Af Amer: >60 mL/min (ref >60)
GLUCOSE: 108 mg/dL — AB (ref 65–99)
POTASSIUM: 5.1 mmol/L (ref 3.5–5.1)
SODIUM: 139 mmol/L (ref 135–145)

## 2015-05-23 MED ORDER — CEFAZOLIN SODIUM-DEXTROSE 2-4 GM/100ML-% IV SOLN
2.0000 g | INTRAVENOUS | Status: DC
  Filled 2015-05-23: qty 100

## 2015-05-24 ENCOUNTER — Encounter (HOSPITAL_COMMUNITY)
Admission: RE | Disposition: A | Discharge status: Home / Self Care | Payer: Self-pay | Source: Ambulatory Visit | Attending: General Surgery

## 2015-05-24 ENCOUNTER — Inpatient Hospital Stay (HOSPITAL_COMMUNITY): Payer: BLUE CROSS/BLUE SHIELD | Admitting: Certified Registered Nurse Anesthetist

## 2015-05-24 ENCOUNTER — Inpatient Hospital Stay (HOSPITAL_COMMUNITY)
Admission: RE | Admit: 2015-05-24 | Discharge: 2015-05-26 | DRG: 544 | Disposition: A | Discharge status: Home / Self Care | Payer: BLUE CROSS/BLUE SHIELD | Source: Ambulatory Visit | Attending: General Surgery | Admitting: General Surgery

## 2015-05-24 ENCOUNTER — Encounter (HOSPITAL_COMMUNITY): Payer: Self-pay | Admitting: Certified Registered Nurse Anesthetist

## 2015-05-24 DIAGNOSIS — K219 Gastro-esophageal reflux disease without esophagitis: Secondary | ICD-10-CM | POA: Diagnosis present

## 2015-05-24 DIAGNOSIS — E875 Hyperkalemia: Secondary | ICD-10-CM | POA: Diagnosis present

## 2015-05-24 DIAGNOSIS — C49A2 Gastrointestinal stromal tumor of stomach: Principal | ICD-10-CM | POA: Diagnosis present

## 2015-05-24 DIAGNOSIS — K635 Polyp of colon: Secondary | ICD-10-CM | POA: Diagnosis present

## 2015-05-24 DIAGNOSIS — I10 Essential (primary) hypertension: Secondary | ICD-10-CM | POA: Diagnosis present

## 2015-05-24 DIAGNOSIS — K5900 Constipation, unspecified: Secondary | ICD-10-CM | POA: Diagnosis present

## 2015-05-24 DIAGNOSIS — K3189 Other diseases of stomach and duodenum: Secondary | ICD-10-CM | POA: Diagnosis present

## 2015-05-24 HISTORY — PX: LAPAROSCOPIC GASTRECTOMY: SHX5894

## 2015-05-24 HISTORY — PX: PARTIAL GASTRECTOMY: SHX2172

## 2015-05-24 SURGERY — GASTRECTOMY, LAPAROSCOPIC
Anesthesia: General | Site: Abdomen

## 2015-05-24 MED ORDER — LIDOCAINE HCL (PF) 1 % IJ SOLN
INTRAMUSCULAR | Status: AC
  Filled 2015-05-24: qty 30

## 2015-05-24 MED ORDER — MIDAZOLAM HCL 2 MG/2ML IJ SOLN
INTRAMUSCULAR | Status: AC
  Filled 2015-05-24: qty 2

## 2015-05-24 MED ORDER — HYDROMORPHONE HCL 1 MG/ML IJ SOLN: 0.5000 mg | INTRAMUSCULAR | Status: DC | PRN

## 2015-05-24 MED ORDER — OXYCODONE HCL 5 MG PO TABS: 5.0000 mg | ORAL_TABLET | Freq: Once | ORAL | Status: DC | PRN

## 2015-05-24 MED ORDER — DIPHENHYDRAMINE HCL 12.5 MG/5ML PO ELIX: 12.5000 mg | ORAL_SOLUTION | Freq: Four times a day (QID) | ORAL | Status: DC | PRN

## 2015-05-24 MED ORDER — SUGAMMADEX SODIUM 500 MG/5ML IV SOLN
INTRAVENOUS | Status: DC | PRN
  Administered 2015-05-24: 200 mg via INTRAVENOUS

## 2015-05-24 MED ORDER — CEFAZOLIN SODIUM-DEXTROSE 2-4 GM/100ML-% IV SOLN
2.0000 g | Freq: Three times a day (TID) | INTRAVENOUS | Status: AC
  Administered 2015-05-24: 2 g via INTRAVENOUS
  Filled 2015-05-24: qty 100

## 2015-05-24 MED ORDER — LIDOCAINE HCL (CARDIAC) 20 MG/ML IV SOLN
INTRAVENOUS | Status: AC
  Filled 2015-05-24: qty 5

## 2015-05-24 MED ORDER — ONDANSETRON HCL 4 MG/2ML IJ SOLN
INTRAMUSCULAR | Status: DC | PRN
  Administered 2015-05-24: 4 mg via INTRAVENOUS

## 2015-05-24 MED ORDER — ACETAMINOPHEN 160 MG/5ML PO SOLN: 325.0000 mg | ORAL | Status: DC | PRN

## 2015-05-24 MED ORDER — PROPOFOL 10 MG/ML IV BOLUS
INTRAVENOUS | Status: AC
  Filled 2015-05-24: qty 40

## 2015-05-24 MED ORDER — SODIUM CHLORIDE 0.9 % IR SOLN
Status: DC | PRN
  Administered 2015-05-24: 1000 mL

## 2015-05-24 MED ORDER — PANTOPRAZOLE SODIUM 40 MG IV SOLR
40.0000 mg | Freq: Every day | INTRAVENOUS | Status: DC
  Administered 2015-05-24: 40 mg via INTRAVENOUS
  Filled 2015-05-24: qty 40

## 2015-05-24 MED ORDER — HYDROMORPHONE HCL 1 MG/ML IJ SOLN
INTRAMUSCULAR | Status: AC
  Administered 2015-05-24: 0.5 mg via INTRAVENOUS
  Filled 2015-05-24: qty 1

## 2015-05-24 MED ORDER — MIDAZOLAM HCL 5 MG/5ML IJ SOLN
INTRAMUSCULAR | Status: DC | PRN
  Administered 2015-05-24: 2 mg via INTRAVENOUS

## 2015-05-24 MED ORDER — ONDANSETRON 4 MG PO TBDP: 4.0000 mg | ORAL_TABLET | Freq: Four times a day (QID) | ORAL | Status: DC | PRN

## 2015-05-24 MED ORDER — ONDANSETRON HCL 4 MG/2ML IJ SOLN
INTRAMUSCULAR | Status: AC
  Filled 2015-05-24: qty 2

## 2015-05-24 MED ORDER — KCL IN DEXTROSE-NACL 20-5-0.45 MEQ/L-%-% IV SOLN
INTRAVENOUS | Status: DC
  Administered 2015-05-24 (×2): via INTRAVENOUS
  Filled 2015-05-24 (×2): qty 1000

## 2015-05-24 MED ORDER — PHENYLEPHRINE 40 MCG/ML (10ML) SYRINGE FOR IV PUSH (FOR BLOOD PRESSURE SUPPORT)
PREFILLED_SYRINGE | INTRAVENOUS | Status: AC
  Filled 2015-05-24: qty 10

## 2015-05-24 MED ORDER — ACETAMINOPHEN 325 MG PO TABS: 325.0000 mg | ORAL_TABLET | ORAL | Status: DC | PRN

## 2015-05-24 MED ORDER — FENTANYL CITRATE (PF) 250 MCG/5ML IJ SOLN
INTRAMUSCULAR | Status: AC
  Filled 2015-05-24: qty 5

## 2015-05-24 MED ORDER — LIDOCAINE HCL (CARDIAC) 20 MG/ML IV SOLN
INTRAVENOUS | Status: DC | PRN
  Administered 2015-05-24: 60 mg via INTRAVENOUS

## 2015-05-24 MED ORDER — ACETAMINOPHEN 650 MG RE SUPP: 650.0000 mg | Freq: Four times a day (QID) | RECTAL | Status: DC | PRN

## 2015-05-24 MED ORDER — BUPIVACAINE-EPINEPHRINE (PF) 0.25% -1:200000 IJ SOLN
INTRAMUSCULAR | Status: AC
  Filled 2015-05-24: qty 30

## 2015-05-24 MED ORDER — LACTATED RINGERS IV SOLN
INTRAVENOUS | Status: DC
  Administered 2015-05-24 (×2): via INTRAVENOUS

## 2015-05-24 MED ORDER — OXYCODONE HCL 5 MG/5ML PO SOLN: 5.0000 mg | Freq: Once | ORAL | Status: DC | PRN

## 2015-05-24 MED ORDER — KETOROLAC TROMETHAMINE 30 MG/ML IJ SOLN: 30.0000 mg | Freq: Four times a day (QID) | INTRAMUSCULAR | Status: DC | PRN

## 2015-05-24 MED ORDER — ACETAMINOPHEN 325 MG PO TABS: 650.0000 mg | ORAL_TABLET | Freq: Four times a day (QID) | ORAL | Status: DC | PRN

## 2015-05-24 MED ORDER — 0.9 % SODIUM CHLORIDE (POUR BTL) OPTIME
TOPICAL | Status: DC | PRN
  Administered 2015-05-24 (×2): 1000 mL

## 2015-05-24 MED ORDER — ROCURONIUM BROMIDE 100 MG/10ML IV SOLN
INTRAVENOUS | Status: DC | PRN
  Administered 2015-05-24: 20 mg via INTRAVENOUS
  Administered 2015-05-24: 10 mg via INTRAVENOUS
  Administered 2015-05-24: 50 mg via INTRAVENOUS

## 2015-05-24 MED ORDER — PROPOFOL 10 MG/ML IV BOLUS
INTRAVENOUS | Status: DC | PRN
Start: 2015-05-24 — End: 2015-05-24
  Administered 2015-05-24: 60 mg via INTRAVENOUS
  Administered 2015-05-24: 140 mg via INTRAVENOUS

## 2015-05-24 MED ORDER — FENTANYL CITRATE (PF) 100 MCG/2ML IJ SOLN
INTRAMUSCULAR | Status: DC | PRN
  Administered 2015-05-24: 150 ug via INTRAVENOUS
  Administered 2015-05-24 (×2): 50 ug via INTRAVENOUS

## 2015-05-24 MED ORDER — SUGAMMADEX SODIUM 500 MG/5ML IV SOLN
INTRAVENOUS | Status: AC
  Filled 2015-05-24: qty 5

## 2015-05-24 MED ORDER — HYDROMORPHONE HCL 1 MG/ML IJ SOLN
0.2500 mg | INTRAMUSCULAR | Status: DC | PRN
  Administered 2015-05-24 (×2): 0.5 mg via INTRAVENOUS

## 2015-05-24 MED ORDER — DIPHENHYDRAMINE HCL 50 MG/ML IJ SOLN: 12.5000 mg | Freq: Four times a day (QID) | INTRAMUSCULAR | Status: DC | PRN

## 2015-05-24 MED ORDER — HYDRALAZINE HCL 20 MG/ML IJ SOLN: 20.0000 mg | INTRAMUSCULAR | Status: DC | PRN

## 2015-05-24 MED ORDER — SUCCINYLCHOLINE CHLORIDE 20 MG/ML IJ SOLN
INTRAMUSCULAR | Status: AC
  Filled 2015-05-24: qty 1

## 2015-05-24 MED ORDER — ONDANSETRON HCL 4 MG/2ML IJ SOLN: 4.0000 mg | Freq: Four times a day (QID) | INTRAMUSCULAR | Status: DC | PRN

## 2015-05-24 MED ORDER — ROCURONIUM BROMIDE 50 MG/5ML IV SOLN
INTRAVENOUS | Status: AC
  Filled 2015-05-24: qty 1

## 2015-05-24 MED ORDER — METOPROLOL TARTRATE 1 MG/ML IV SOLN
7.5000 mg | Freq: Four times a day (QID) | INTRAVENOUS | Status: DC
  Administered 2015-05-24 – 2015-05-25 (×2): 7.5 mg via INTRAVENOUS
  Filled 2015-05-24 (×2): qty 10

## 2015-05-24 MED ORDER — PHENYLEPHRINE HCL 10 MG/ML IJ SOLN
INTRAMUSCULAR | Status: DC | PRN
  Administered 2015-05-24 (×2): 80 ug via INTRAVENOUS

## 2015-05-24 MED ORDER — SODIUM CHLORIDE 0.9 % IV SOLN
INTRAVENOUS | Status: DC | PRN
  Administered 2015-05-24: 40 mL

## 2015-05-24 MED ORDER — BUPIVACAINE LIPOSOME 1.3 % IJ SUSP
20.0000 mL | INTRAMUSCULAR | Status: DC
  Filled 2015-05-24: qty 20

## 2015-05-24 MED ORDER — LIDOCAINE HCL 1 % IJ SOLN
INTRAMUSCULAR | Status: DC | PRN
  Administered 2015-05-24: 7 mL via INTRAMUSCULAR

## 2015-05-24 MED ORDER — KETOROLAC TROMETHAMINE 30 MG/ML IJ SOLN
30.0000 mg | Freq: Four times a day (QID) | INTRAMUSCULAR | Status: DC
  Administered 2015-05-24 – 2015-05-25 (×7): 30 mg via INTRAVENOUS
  Filled 2015-05-24 (×8): qty 1

## 2015-05-24 MED ORDER — CEFAZOLIN SODIUM-DEXTROSE 2-3 GM-% IV SOLR
INTRAVENOUS | Status: DC | PRN
  Administered 2015-05-24: 2 g via INTRAVENOUS

## 2015-05-24 SURGICAL SUPPLY — 80 items
ADH SKN CLS APL DERMABOND .7 (GAUZE/BANDAGES/DRESSINGS) ×1
APL SRG 32X5 SNPLK LF DISP (MISCELLANEOUS)
BAG SPEC RTRVL LRG 6X4 10 (ENDOMECHANICALS) ×2
BLADE SURG ROTATE 9660 (MISCELLANEOUS) IMPLANT
CANISTER SUCTION 2500CC (MISCELLANEOUS) ×3 IMPLANT
CHLORAPREP W/TINT 26ML (MISCELLANEOUS) ×3 IMPLANT
COVER SURGICAL LIGHT HANDLE (MISCELLANEOUS) ×3 IMPLANT
DERMABOND ADVANCED (GAUZE/BANDAGES/DRESSINGS) ×2
DERMABOND ADVANCED .7 DNX12 (GAUZE/BANDAGES/DRESSINGS) IMPLANT
DRAIN CHANNEL 19F RND (DRAIN) IMPLANT
DRAIN PENROSE 1/2X36 STERILE (WOUND CARE) IMPLANT
DRAPE UTILITY XL STRL (DRAPES) ×6 IMPLANT
DRAPE WARM FLUID 44X44 (DRAPE) ×3 IMPLANT
DRSG COVADERM 4X10 (GAUZE/BANDAGES/DRESSINGS) IMPLANT
DRSG COVADERM 4X14 (GAUZE/BANDAGES/DRESSINGS) IMPLANT
ELECT BLADE 6.5 EXT (BLADE) IMPLANT
ELECT CAUTERY BLADE 6.4 (BLADE) IMPLANT
ELECT REM PT RETURN 9FT ADLT (ELECTROSURGICAL) ×3
ELECTRODE REM PT RTRN 9FT ADLT (ELECTROSURGICAL) ×1 IMPLANT
EVACUATOR SILICONE 100CC (DRAIN) IMPLANT
GAUZE SPONGE 4X4 12PLY STRL (GAUZE/BANDAGES/DRESSINGS) IMPLANT
GLOVE BIO SURGEON STRL SZ 6 (GLOVE) ×3 IMPLANT
GLOVE BIO SURGEON STRL SZ7.5 (GLOVE) ×6 IMPLANT
GLOVE BIOGEL PI IND STRL 6.5 (GLOVE) ×1 IMPLANT
GLOVE BIOGEL PI IND STRL 7.5 (GLOVE) IMPLANT
GLOVE BIOGEL PI INDICATOR 6.5 (GLOVE) ×2
GLOVE BIOGEL PI INDICATOR 7.5 (GLOVE) ×6
GLOVE ECLIPSE 7.5 STRL STRAW (GLOVE) ×3 IMPLANT
GLOVE SURG SS PI 6.5 STRL IVOR (GLOVE) ×2 IMPLANT
GOWN STRL REUS W/ TWL LRG LVL3 (GOWN DISPOSABLE) ×3 IMPLANT
GOWN STRL REUS W/TWL 2XL LVL3 (GOWN DISPOSABLE) ×3 IMPLANT
GOWN STRL REUS W/TWL LRG LVL3 (GOWN DISPOSABLE) ×9
HEMOSTAT SURGICEL 2X14 (HEMOSTASIS) IMPLANT
KIT BASIN OR (CUSTOM PROCEDURE TRAY) ×3 IMPLANT
KIT ROOM TURNOVER OR (KITS) ×3 IMPLANT
LOOP VESSEL MAXI BLUE (MISCELLANEOUS) IMPLANT
NDL HYPO 25GX1X1/2 BEV (NEEDLE) IMPLANT
NEEDLE HYPO 25GX1X1/2 BEV (NEEDLE) ×3 IMPLANT
NS IRRIG 1000ML POUR BTL (IV SOLUTION) ×6 IMPLANT
PAD ARMBOARD 7.5X6 YLW CONV (MISCELLANEOUS) ×6 IMPLANT
PENCIL BUTTON HOLSTER BLD 10FT (ELECTRODE) IMPLANT
POUCH SPECIMEN RETRIEVAL 10MM (ENDOMECHANICALS) ×5 IMPLANT
RELOAD BLUE (STAPLE) ×10 IMPLANT
RELOAD WHITE ECR60W (STAPLE) IMPLANT
SCALPEL HARMONIC ACE (MISCELLANEOUS) ×3 IMPLANT
SCISSORS LAP 5X35 DISP (ENDOMECHANICALS) ×2 IMPLANT
SEALANT SURGICAL APPL DUAL CAN (MISCELLANEOUS) IMPLANT
SET IRRIG TUBING LAPAROSCOPIC (IRRIGATION / IRRIGATOR) IMPLANT
SHEARS HARMONIC ACE PLUS 36CM (ENDOMECHANICALS) ×2 IMPLANT
SLEEVE ENDOPATH XCEL 5M (ENDOMECHANICALS) ×8 IMPLANT
STAPLE ECHEON FLEX 60 POW ENDO (STAPLE) ×4 IMPLANT
STAPLER STANDARD HANDLE (STAPLE) ×2 IMPLANT
STAPLER VISISTAT 35W (STAPLE) IMPLANT
SUT ETHILON 2 0 FS 18 (SUTURE) IMPLANT
SUT MNCRL AB 4-0 PS2 18 (SUTURE) ×3 IMPLANT
SUT PDS AB 1 TP1 96 (SUTURE) IMPLANT
SUT PDS II 0 TP-1 LOOPED 60 (SUTURE) IMPLANT
SUT SILK 2 0 SH CR/8 (SUTURE) ×3 IMPLANT
SUT SILK 2 0 TIES 10X30 (SUTURE) ×3 IMPLANT
SUT SILK 3 0 SH CR/8 (SUTURE) ×3 IMPLANT
SUT SILK 3 0 TIES 10X30 (SUTURE) ×3 IMPLANT
SYR CONTROL 10ML LL (SYRINGE) ×2 IMPLANT
SYS LAPSCP GELPORT 120MM (MISCELLANEOUS)
SYSTEM LAPSCP GELPORT 120MM (MISCELLANEOUS) IMPLANT
TIP INNERVISION DETACH 40FR (MISCELLANEOUS) IMPLANT
TIP INNERVISION DETACH 50FR (MISCELLANEOUS) IMPLANT
TIP INNERVISION DETACH 56FR (MISCELLANEOUS) IMPLANT
TIPS INNERVISION DETACH 40FR (MISCELLANEOUS)
TOWEL OR 17X24 6PK STRL BLUE (TOWEL DISPOSABLE) ×3 IMPLANT
TOWEL OR 17X26 10 PK STRL BLUE (TOWEL DISPOSABLE) ×3 IMPLANT
TRAY FOLEY CATH 14FRSI W/METER (CATHETERS) ×3 IMPLANT
TRAY LAPAROSCOPIC MC (CUSTOM PROCEDURE TRAY) ×3 IMPLANT
TROCAR BLADELESS 12MM (ENDOMECHANICALS) IMPLANT
TROCAR XCEL 12X100 BLDLESS (ENDOMECHANICALS) ×3 IMPLANT
TROCAR XCEL BLUNT TIP 100MML (ENDOMECHANICALS) IMPLANT
TROCAR XCEL NON-BLD 5MMX100MML (ENDOMECHANICALS) ×3 IMPLANT
TUBE CONNECTING 12'X1/4 (SUCTIONS)
TUBE CONNECTING 12X1/4 (SUCTIONS) IMPLANT
TUBING FILTER THERMOFLATOR (ELECTROSURGICAL) ×3 IMPLANT
YANKAUER SUCT BULB TIP NO VENT (SUCTIONS) IMPLANT

## 2015-05-24 NOTE — Care Management Note (Signed)
Case Management Note  Patient Details  Name: Wesley Savage MRN: [id number] Date of Birth: 04-09-1960  Subjective/Objective:                    Action/Plan:  Initial UR completed . CM will continue to follow for discharge needs.  Expected Discharge Date:                  Expected Discharge Plan:  Home/Self Care  In-House Referral:     Discharge planning Services     Post Acute Care Choice:    Choice offered to:     DME Arranged:    DME Agency:     HH Arranged:    HH Agency:     Status of Service:  In process, will continue to follow  Medicare Important Message Given:    Date Medicare IM Given:    Medicare IM give by:    Date Additional Medicare IM Given:    Additional Medicare Important Message give by:     If discussed at [place] of Stay Meetings, dates discussed:    Additional Comments:  Marilu Favre, RN 05/24/2015, 1:17 PM

## 2015-05-24 NOTE — Discharge Instructions (Addendum)
[facility], [place] [phone]  ABDOMINAL SURGERY: POST OP INSTRUCTIONS  Always review your discharge instruction sheet given to you by the facility where your surgery was performed.  IF YOU HAVE DISABILITY OR FAMILY LEAVE FORMS, YOU MUST BRING THEM TO THE OFFICE FOR PROCESSING.  PLEASE DO NOT GIVE THEM TO YOUR DOCTOR.  1. A prescription for pain medication may be given to you upon discharge.  Take your pain medication as prescribed, if needed.  If narcotic pain medicine is not needed, then you may take acetaminophen (Tylenol) or ibuprofen (Advil) as needed.  Do not take ibuprofen more than 1-2 times per day, and if you do take it, take it with food.  This is because you have borderline normal kidney function. 2. Take your usually prescribed medications unless otherwise directed. 3. If you need a refill on your pain medication, please contact your pharmacy. They will contact our office to request authorization.  Prescriptions will not be filled after 5pm or on week-ends. 4. You should follow a soft/liquid diet the first week after arrival home, such as soup and crackers, pudding, etc. After that, normal diet can be resumed as tolerated.   Be sure to include lots of fluids daily. Most patients will experience some swelling and bruising at the incisions.  Ice packs will help.  Swelling and bruising can take several days to resolve 5. Most patients will experience some swelling and bruising in the area of the incision. Ice pack will help. Swelling and bruising can take several days to resolve..  6. It is common to experience some constipation if taking pain medication after surgery.  Increasing fluid intake and taking a stool softener will usually help or prevent this problem from occurring.  A mild laxative (Milk of Magnesia or Miralax) should be taken according to package directions if there are no bowel movements after 48 hours. 7.   If your surgeon used skin glue on the incision,  you may shower today.  The glue will flake off over the next 2-3 weeks.  Any sutures or staples will be removed at the office during your follow-up visit. You may find that a light gauze bandage over your incision may keep your staples from being rubbed or pulled. You may shower and replace the bandage daily. 8. ACTIVITIES:  You may resume regular (light) daily activities beginning the next day--such as daily self-care, walking, climbing stairs--gradually increasing activities as tolerated.  You may have sexual intercourse when it is comfortable.  Refrain from any heavy lifting or straining until approved by your doctor. a. You may drive when you no longer are taking prescription pain medication, you can comfortably wear a seatbelt, and you can safely maneuver your car and apply brakes b. Return to Work: [id number] weeks if applicable_________________________ 9. You should see your doctor in the office for a follow-up appointment approximately two weeks after your surgery.  Make sure that you call for this appointment within a day or two after you arrive home to insure a convenient appointment time. OTHER INSTRUCTIONS:  _____________________________________________________________ _____________________________________________________________  WHEN TO CALL YOUR DOCTOR: 1. Fever over 101.0 2. Inability to urinate 3. Nausea and/or vomiting 4. Extreme swelling or bruising 5. Continued bleeding from incision. 6. Increased pain, redness, or drainage from the incision. 7. Difficulty swallowing or breathing 8. Muscle cramping or spasms. 9. Numbness or tingling in hands or feet or around lips.  The clinic staff is available to answer your questions during  regular business hours.  Please dont hesitate to call and ask to speak to one of the nurses if you have concerns.  For further questions, please visit [email or web address]

## 2015-05-24 NOTE — Anesthesia Procedure Notes (Signed)
Procedure Name: Intubation Date/Time: 05/24/2015 8:25 AM Performed by: Salli Quarry Eriq Hufford Pre-anesthesia Checklist: Patient identified, Emergency Drugs available, Suction available and Patient being monitored Patient Re-evaluated:Patient Re-evaluated prior to inductionOxygen Delivery Method: Circle System Utilized Preoxygenation: Pre-oxygenation with 100% oxygen Intubation Type: IV induction Ventilation: Mask ventilation without difficulty Laryngoscope Size: Mac and 4 Grade View: Grade II Tube type: Oral Tube size: 7.5 mm Number of attempts: 1 Airway Equipment and Method: Stylet Placement Confirmation: ETT inserted through vocal cords under direct vision and breath sounds checked- equal and bilateral Secured at: 23 cm Tube secured with: Tape Dental Injury: Teeth and Oropharynx as per pre-operative assessment

## 2015-05-24 NOTE — Anesthesia Preprocedure Evaluation (Addendum)
Anesthesia Evaluation  Patient identified by MRN, date of birth, ID band Patient awake    Reviewed: Allergy & Precautions, NPO status , Patient's Chart, lab work & pertinent test results, reviewed documented beta blocker date and time   History of Anesthesia Complications Negative for: history of anesthetic complications  Airway Mallampati: II  TM Distance: >3 FB Neck ROM: Full    Dental  (+) Teeth Intact   Pulmonary neg pulmonary ROS,    breath sounds clear to auscultation       Cardiovascular hypertension, Pt. on medications and Pt. on home beta blockers (-) angina(-) Past MI and (-) CHF  Rhythm:Regular     Neuro/Psych negative neurological ROS  negative psych ROS   GI/Hepatic Neg liver ROS, PUD, GERD  Medicated,  Endo/Other  negative endocrine ROS  Renal/GU negative Renal ROS     Musculoskeletal negative musculoskeletal ROS (+)   Abdominal   Peds  Hematology negative hematology ROS (+)   Anesthesia Other Findings   Reproductive/Obstetrics                            Anesthesia Physical Anesthesia Plan  ASA: III  Anesthesia Plan: General   Post-op Pain Management:    Induction: Intravenous  Airway Management Planned: Oral ETT  Additional Equipment: None  Intra-op Plan:   Post-operative Plan: Extubation in OR  Informed Consent: I have reviewed the patients History and Physical, chart, labs and discussed the procedure including the risks, benefits and alternatives for the proposed anesthesia with the patient or authorized representative who has indicated his/her understanding and acceptance.   Dental advisory given  Plan Discussed with: CRNA and Surgeon  Anesthesia Plan Comments:         Anesthesia Quick Evaluation

## 2015-05-24 NOTE — Transfer of Care (Signed)
Immediate Anesthesia Transfer of Care Note  Patient: Wesley Savage  Procedure(s) Performed: Procedure(s): LAPAROSCOPIC PARTIAL GASTRECTOMY (N/A)  Patient Location: PACU  Anesthesia Type:General  Level of Consciousness: awake, alert , oriented and patient cooperative  Airway & Oxygen Therapy: Patient Spontanous Breathing and Patient connected to nasal cannula oxygen  Post-op Assessment: Report given to RN and Post -op Vital signs reviewed and stable  Post vital signs: Reviewed and stable  Last Vitals:  Filed Vitals:   05/24/15 0632  BP: 147/87  Pulse: 72  Temp: 36.8 C  Resp: 20    Complications: No apparent anesthesia complications

## 2015-05-24 NOTE — Op Note (Signed)
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   FINDINGS:  Rubbery mass in proximal stomach approximately 2 cm.  PROCEDURE:  Pt was identified in the holding area and taken to the operating room and placed supine on the operating room table.  General anesthesia was induced.  A foley catheter was placed.  The abdomen was prepped and draped in sterile fashion.  Timeout was performed according to the surgical safety checklist.  When all was correct, we continued.    The patient was placed in reverse Trendelenberg position and rotated to the left.  A 5 mm Optiview trocar was placed at the left costal margin under direct visualization.  One 5 mm trocar was placed in the LUQ, and another 5 mm trocar was placed in the RUQ.  Pneumoperitoneum was achieved to a pressure of 15 mm Hg.  The harmonic scalpel was used to open the lesser sac and take down the omentum off the gastric edge.    The powered Echelon was used to attempt to take the omentum off the greater curve.  The mass was located at a dimple on the posterior stomach.  However, the battery appeared to be dead and the stapler locked.  This occurred twice.  Two new staplers were opened.  Three fires of the Echelon stapler were used to divide the greater curve with the mass from the antrum of the stomach.  There was no bleeding at the staple line.  The mass was retrieved with an endocatch bag via the 12 mm port.  This was examined to make sure the gross margin  was negative.    The left upper quadrant was reexamined for hemostasis.  There was no evidence of bleeding.  A 4 quadrant inspection was performed and was negative for signs of succus or blood.  The 12 mm port was closed with a 0 vicryl figure of 8 suture.  There was no air leakage from the port.  The remaining ports were removed and pneumoperitoneum was allowed to evacuate.  The skin of all the incisions was closed with 4-0 Monocryl in subcuticular fashion.  The wounds were cleaned, dried, and dressed with dermabond. Needle, sponge, and instrument counts were correct.  The patient was awakened from anesthesia and taken to the PACU in stable condition.

## 2015-05-24 NOTE — H&P (Signed)
Wesley Savage 05/09/2015 10:25 AM Location: [facility] Patient #: [id number] DOB: 01/22/1961 Divorced / Language: Wesley Savage / Race: White Male   History of Present Illness Wesley Klein Wesley Savage; 05/10/2015 9:43 AM) The patient is a 55 year old male who presents with a complaint of Mass. Patient is seen in consultation at the request of Dr. Benson Savage for a gastric mass. He is a 55 yo M who has been seeing him for chronic abdominal pain. He had H pylori in 2011 that resolved with antibiotics. He also had chronic issues wtih constipation. Recently he had a resurgence of LUQ pain and he underwent repeat EGD to evaluate him for ulcer/gastritis. He had minimal gastritis, but he was found to have a submucosal nodule on his greater curve toward the antrum. On EGD, this looked like about 1 cm, but on EUS was larger at 1.8 cm on greatest dimension. He is not having n/v/d. He denies early satiety. He is not having blood in his stools. He is on Amitiza for constipation, and this has helped a bit with his pain.   Diagnosis FINE NEEDLE ASPIRATION, ENDOSCOPIC, GASTRIC, SUBMUCOSAL ANTRAL (SPECIMEN 1 OF 1 COLLECTED 04/08/15): GASTROINTESTINAL STROMAL TUMOR.   Other Problems Wesley Savage, Wesley Savage; 05/09/2015 10:25 AM) Back Pain Cholelithiasis Diverticulosis Gastroesophageal Reflux Disease High blood pressure Kidney Stone Ulcerative Colitis Umbilical Hernia Repair  Past Surgical History Wesley Savage, Wesley Savage; 05/09/2015 10:25 AM) Colon Polyp Removal - Colonoscopy Oral Surgery Ventral / Umbilical Hernia Surgery Bilateral.  Diagnostic Studies History Wesley Savage, Wesley Savage; 05/09/2015 10:25 AM) Colonoscopy within last year  Allergies Wesley Savage, Wesley Savage; 05/09/2015 10:25 AM) No Known Drug Allergies04/04/2015  Medication History Wesley Savage, Wesley Savage; 05/09/2015 10:27 AM) Metoprolol Tartrate (50MG  Tablet, Oral two times daily) Active. Losartan Potassium (100MG  Tablet, Oral daily) Active. Aspirin (81MG   Tablet, Oral daily) Active. Fish Oil (1200MG  Capsule, Oral two times daily) Active. Multiple Vitamin (Oral daily) Active. Amitiza (24MCG Capsule, Oral two times daily) Active. Omeprazole (40MG  Capsule DR, Oral daily) Active. Medications Reconciled  Social History Wesley Savage, Wesley Savage; 05/09/2015 10:25 AM) Alcohol use Remotely quit alcohol use. Caffeine use Carbonated beverages, Coffee. No drug use Tobacco use Never smoker.  Family History Wesley Savage, Wesley Savage; 05/09/2015 10:25 AM) Arthritis Family Members In General, Mother. Cancer Family Members In General. Colon Polyps Brother, Mother. Diabetes Mellitus Brother, Family Members In [place], Father. Heart Disease Brother, Father. Hypertension Brother, Mother. Kidney Disease Family Members In General. Prostate Cancer Father. Respiratory Condition Family Members In General. Thyroid problems Family Members In General.    Review of Systems Wesley Savage Wesley Savage; 05/09/2015 10:25 AM) General Not Present- Appetite Loss, Chills, Fatigue, Fever, Night Sweats, Weight Gain and Weight Loss. Skin Present- Dryness. Not Present- Change in Wart/Mole, Hives, Jaundice, New Lesions, Non-Healing Wounds, Rash and Ulcer. HEENT Present- Seasonal Allergies and Wears glasses/contact lenses. Not Present- Earache, Hearing Loss, Hoarseness, Nose Bleed, Oral Ulcers, Ringing in the Ears, Sinus Pain, Sore Throat, Visual Disturbances and Yellow Eyes. Respiratory Present- Chronic Cough and Snoring. Not Present- Bloody sputum, Difficulty Breathing and Wheezing. Breast Not Present- Breast Mass, Breast Pain, Nipple Discharge and Skin Changes. Cardiovascular Not Present- Chest Pain, Difficulty Breathing Lying Down, Leg Cramps, Palpitations, Rapid Heart Rate, Shortness of Breath and Swelling of Extremities. Gastrointestinal Present- Abdominal Pain, Bloating, Constipation, Excessive gas and Gets full quickly at meals. Not Present- Bloody Stool, Change in Bowel Habits,  Chronic diarrhea, Difficulty Swallowing, Hemorrhoids, Indigestion, Nausea, Rectal Pain and Vomiting. Male Genitourinary Not Present- Blood in Urine, Change in Urinary Stream, Frequency,  Impotence, Nocturia, Painful Urination, Urgency and Urine Leakage. Musculoskeletal Not Present- Back Pain, Joint Pain, Joint Stiffness, Muscle Pain, Muscle Weakness and Swelling of Extremities. Neurological Not Present- Decreased Memory, Fainting, Headaches, Numbness, Seizures, Tingling, Tremor, Trouble walking and Weakness. Psychiatric Not Present- Anxiety, Bipolar, Change in Sleep Pattern, Depression, Fearful and Frequent crying. Endocrine Not Present- Cold Intolerance, Excessive Hunger, Hair Changes, Heat Intolerance, Hot flashes and New Diabetes. Hematology Not Present- Easy Bruising, Excessive bleeding, Gland problems, HIV and Persistent Infections.  Vitals Wesley Savage Wesley Savage; 05/09/2015 10:27 AM) 05/09/2015 10:27 AM Weight: 219.8 lb Height: 71in Body Surface Area: 2.2 m Body Mass Index: 30.66 kg/m  Temp.: 97.26F  Pulse: 63 (Regular)  BP: 130/84 (Sitting, Left Arm, Standard)       Physical Exam Wesley Klein Wesley Savage; 05/10/2015 9:27 AM) General Mental Status-Alert. General Appearance-Consistent with stated age. Hydration-Well hydrated. Voice-Normal.  Head and Neck Head-normocephalic, atraumatic with no lesions or palpable masses. Trachea-midline. Thyroid Gland Characteristics - normal size and consistency.  Eye Eyeball - Bilateral-Extraocular movements intact. Sclera/Conjunctiva - Bilateral-No scleral icterus.  Chest and Lung Exam Chest and lung exam reveals -quiet, even and easy respiratory effort with no use of accessory muscles and on auscultation, normal breath sounds, no adventitious sounds and normal vocal resonance. Inspection Chest Wall - Normal. Back - normal.  Cardiovascular Cardiovascular examination reveals -normal heart sounds, regular rate and rhythm  with no murmurs and normal pedal pulses bilaterally.  Abdomen Inspection Inspection of the abdomen reveals - No Hernias. Palpation/Percussion Palpation and Percussion of the abdomen reveal - Soft, Non Tender, No Rebound tenderness, No Rigidity (guarding) and No hepatosplenomegaly. Auscultation Auscultation of the abdomen reveals - Bowel sounds normal.  Neurologic Neurologic evaluation reveals -alert and oriented x 3 with no impairment of recent or remote memory. Mental Status-Normal.  Musculoskeletal Global Assessment -Note: no gross deformities.  Normal Exam - Left-Upper Extremity Strength Normal and Lower Extremity Strength Normal. Normal Exam - Right-Upper Extremity Strength Normal and Lower Extremity Strength Normal.  Lymphatic Head & Neck  General Head & Neck Lymphatics: Bilateral - Description - Normal. Axillary  General Axillary Region: Bilateral - Description - Normal. Tenderness - Non Tender. Femoral & Inguinal  Generalized Femoral & Inguinal Lymphatics: Bilateral - Description - No Generalized lymphadenopathy.    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 I would do this laparoscopically. i discussed that it may be close to the pylorus/antrum, and that provides a slightly higher risk for post op nausea. We discussed the other risks of surgery including bleeding, infection, damage to adjacent structures, staple line bleed or leak, heart or lung complications, difficulty eating, and hernia.  Because of his prior umbilical hernia repair, I will place the periumbilical incision either above or below his mesh depending on the lie of the tumor and the stomach.  We discussed post operative restrictions and time off work. He  would like to get this done as soon as possible.  40 min spent in evaluation, examination, counseling, and coordination of care. >50% spent in counseling. Current Plans You are being scheduled for surgery - Our schedulers will call you.  You should hear from our office's scheduling department within 5 working days about the location, date, and time of surgery. We try to make accommodations for patient's  preferences in scheduling surgery, but sometimes the OR schedule or the surgeon's schedule prevents [place] from making those accommodations.  If you have not heard from our office [phone]) in 5 working days, call the office and ask for your surgeon's nurse.  If you have other questions about your diagnosis, plan, or surgery, call the office and ask for your surgeon's nurse.  Pt Education - [facility] Free Text Education/Instructions: discussed with patient and provided information.   Signed by Wesley Klein, Wesley Savage (05/10/2015 9:44 AM)

## 2015-05-24 NOTE — Interval H&P Note (Signed)
History and Physical Interval Note:  05/24/2015 8:10 AM  Wesley Savage  has presented today for surgery, with the diagnosis of gastric mass  The various methods of treatment have been discussed with the patient and family. After consideration of risks, benefits and other options for treatment, the patient has consented to  Procedure(s): LAPAROSCOPIC PARTIAL GASTRECTOMY (N/A) as a surgical intervention .  The patient's history has been reviewed, patient examined, no change in status, stable for surgery.  I have reviewed the patient's chart and labs.  Questions were answered to the patient's satisfaction.     Susette Seminara

## 2015-05-25 ENCOUNTER — Encounter (HOSPITAL_COMMUNITY): Payer: Self-pay | Admitting: General Surgery

## 2015-05-25 LAB — CBC
HCT: 37.3 % — ABNORMAL LOW (ref 39.0–52.0)
Hemoglobin: 12.5 g/dL — ABNORMAL LOW (ref 13.0–17.0)
MCH: 29.9 pg (ref 26.0–34.0)
MCHC: 33.5 g/dL (ref 30.0–36.0)
MCV: 89.2 fL (ref 78.0–100.0)
PLATELETS: 106 10*3/uL — AB (ref 150–400)
RBC: 4.18 MIL/uL — AB (ref 4.22–5.81)
RDW: 13.7 % (ref 11.5–15.5)
WBC: 8.9 10*3/uL (ref 4.0–10.5)

## 2015-05-25 LAB — BASIC METABOLIC PANEL
Anion gap: 9 (ref 5–15)
BUN: 16 mg/dL (ref 6–20)
CO2: 21 mmol/L — ABNORMAL LOW (ref 22–32)
CREATININE: 1.25 mg/dL — AB (ref 0.61–1.24)
Calcium: 8 mg/dL — ABNORMAL LOW (ref 8.9–10.3)
Chloride: 109 mmol/L (ref 101–111)
GFR calc Af Amer: >60 mL/min (ref >60)
Glucose, Bld: 123 mg/dL — ABNORMAL HIGH (ref 65–99)
Potassium: 4.2 mmol/L (ref 3.5–5.1)
SODIUM: 139 mmol/L (ref 135–145)

## 2015-05-25 MED ORDER — LOSARTAN POTASSIUM 50 MG PO TABS: 100.0000 mg | ORAL_TABLET | Freq: Every day | ORAL | Status: DC

## 2015-05-25 MED ORDER — PSYLLIUM 95 % PO PACK
1.0000 | PACK | Freq: Every day | ORAL | Status: DC
  Administered 2015-05-25: 1 via ORAL
  Filled 2015-05-25 (×2): qty 1

## 2015-05-25 MED ORDER — METOPROLOL TARTRATE 50 MG PO TABS
50.0000 mg | ORAL_TABLET | Freq: Two times a day (BID) | ORAL | Status: DC
  Administered 2015-05-26: 50 mg via ORAL
  Filled 2015-05-25 (×2): qty 1

## 2015-05-25 MED ORDER — DEXTROSE-NACL 5-0.45 % IV SOLN
INTRAVENOUS | Status: DC
Start: 2015-05-25 — End: 2015-05-26
  Administered 2015-05-25: 08:00:00 via INTRAVENOUS

## 2015-05-25 MED ORDER — OXYCODONE HCL 5 MG PO TABS: 5.0000 mg | ORAL_TABLET | ORAL | Status: DC | PRN

## 2015-05-25 MED ORDER — PANTOPRAZOLE SODIUM 40 MG PO TBEC
40.0000 mg | DELAYED_RELEASE_TABLET | Freq: Every day | ORAL | Status: DC
  Administered 2015-05-25 – 2015-05-26 (×2): 40 mg via ORAL
  Filled 2015-05-25 (×2): qty 1

## 2015-05-25 NOTE — Progress Notes (Signed)
1 Day Post-Op  Subjective: Doing well.  No n/v.  + flatus.    Objective: Vital signs in last 24 hours: Temp:  [97.6 F (36.4 C)-98.6 F (37 C)] 98.5 F (36.9 C) (04/19 0545) Pulse Rate:  [59-72] 64 (04/19 0545) Resp:  [7-19] 16 (04/19 0545) BP: (101-123)/(60-79) 123/75 mmHg (04/19 0545) SpO2:  [95 %-100 %] 96 % (04/19 0545) Weight:  [100.245 kg (221 lb)] 100.245 kg (221 lb) (04/18 1223) Last BM Date: 05/24/15  Intake/Output from previous day: 04/18 0701 - 04/19 0700 In: 1940 [P.O.:140; I.V.:1800] Out: 1035 [Urine:1025; Blood:10] Intake/Output this shift: Total I/O In: 880 [P.O.:80; I.V.:800] Out: 625 [Urine:625]  General appearance: alert, cooperative and no distress Resp: breathing comfortably GI: soft, non distended, approp tender.  Lab Results:   Recent Labs  05/25/15 0520  WBC 8.9  HGB 12.5*  HCT 37.3*  PLT PENDING   BMET No results for input(s): NA, K, CL, CO2, GLUCOSE, BUN, CREATININE, CALCIUM in the last 72 hours. PT/INR No results for input(s): LABPROT, INR in the last 72 hours. ABG No results for input(s): PHART, HCO3 in the last 72 hours.  Invalid input(s): PCO2, PO2  Studies/Results: No results found.  Anti-infectives: Anti-infectives    Start     Dose/Rate Route Frequency Ordered Stop   05/24/15 1600  ceFAZolin (ANCEF) IVPB 2g/100 mL premix     2 g 200 mL/hr over 30 Minutes Intravenous Every 8 hours 05/24/15 1220 05/24/15 1611   05/24/15 0600  ceFAZolin (ANCEF) IVPB 2g/100 mL premix  Status:  Discontinued     2 g 200 mL/hr over 30 Minutes Intravenous On call to O.R. 05/23/15 1201 05/24/15 1219      Assessment/Plan: s/p Procedure(s): LAPAROSCOPIC PARTIAL GASTRECTOMY (N/A) PAS Advance diet hyperkalemia - remove K from IVF  Clears today.  Full liquids tonight for dinner if no nausea.   Add metamucil.  Add oral pain meds.     LOS: 1 day    [facility] 05/25/2015

## 2015-05-25 NOTE — Anesthesia Postprocedure Evaluation (Signed)
Anesthesia Post Note  Patient: Wesley Savage  Procedure(s) Performed: Procedure(s) (LRB): LAPAROSCOPIC PARTIAL GASTRECTOMY (N/A)  Patient location during evaluation: PACU Anesthesia Type: General Level of consciousness: awake Pain management: pain level controlled Vital Signs Assessment: post-procedure vital signs reviewed and stable Respiratory status: spontaneous breathing Postop Assessment: no signs of nausea or vomiting Anesthetic complications: no    Last Vitals:  Filed Vitals:   05/25/15 1312 05/25/15 2131  BP: 120/71 112/66  Pulse: 62 58  Temp: 36.6 C 36.4 C  Resp: 17 18    Last Pain:  Filed Vitals:   05/25/15 2134  PainSc: 2                  Duvall Comes

## 2015-05-26 LAB — BASIC METABOLIC PANEL
Anion gap: 9 (ref 5–15)
BUN: 10 mg/dL (ref 6–20)
CO2: 25 mmol/L (ref 22–32)
Calcium: 8.4 mg/dL — ABNORMAL LOW (ref 8.9–10.3)
Chloride: 107 mmol/L (ref 101–111)
Creatinine, Ser: 1.18 mg/dL (ref 0.61–1.24)
GFR calc Af Amer: >60 mL/min (ref >60)
GFR calc non Af Amer: >60 mL/min (ref >60)
Glucose, Bld: 94 mg/dL (ref 65–99)
Potassium: 4 mmol/L (ref 3.5–5.1)
Sodium: 141 mmol/L (ref 135–145)

## 2015-05-26 LAB — CBC
HCT: 38.3 % — ABNORMAL LOW (ref 39.0–52.0)
Hemoglobin: 12.5 g/dL — ABNORMAL LOW (ref 13.0–17.0)
MCH: 29.3 pg (ref 26.0–34.0)
MCHC: 32.6 g/dL (ref 30.0–36.0)
MCV: 89.9 fL (ref 78.0–100.0)
PLATELETS: 114 10*3/uL — AB (ref 150–400)
RBC: 4.26 MIL/uL (ref 4.22–5.81)
RDW: 13.5 % (ref 11.5–15.5)
WBC: 5.9 10*3/uL (ref 4.0–10.5)

## 2015-05-26 MED ORDER — OXYCODONE HCL 5 MG PO TABS: 5.0000 mg | ORAL_TABLET | ORAL | Status: DC | PRN

## 2015-05-26 NOTE — Discharge Planning (Addendum)
AVS and rx to patient who verbalizes understanding. Tol diet and no pain rx required this am, fully ambulatory. Dc to private car home with all personal belongings at 1020.

## 2015-05-26 NOTE — Discharge Summary (Signed)
Physician Discharge Summary  Patient ID: Wesley Savage MRN: [id number] DOB/AGE: 1960-02-28 55 y.o.  Admit date: 05/24/2015 Discharge date: 05/26/2015  Admission Diagnoses: Patient Active Problem List   Diagnosis Date Noted  . 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 Constipation   . HELICOBACTER PYLORI INFECTION 03/29/2009  . PERSONAL HX COLONIC POLYPS 03/29/2009  . GERD 02/09/2009  . CONSTIPATION 02/09/2009  . CHEST PAIN 02/09/2009  . CHANGE IN BOWELS 02/09/2009  . ABDOMINAL PAIN, LEFT UPPER QUADRANT 02/09/2009  . OTHER NONSPECIFIC ABNORMAL SERUM ENZYME LEVELS 02/09/2009    Discharge Diagnoses:  Active Problems:   Gastric mass same  Discharged Condition: stable  Hospital Course: Pt was admitted to the floor following a laparoscopic resection of gastric mass.  He did well, taking minimal narcotic.  He was ambulatory and able to void spontaneously.  He tolerated clears and full liquids prior to discharge.  He was discharged in stable condition.    Consults: None  Significant Diagnostic Studies: labs: Cr stable.    Treatments: surgery: see above.  Discharge Exam: Blood pressure 126/74, pulse 63, temperature 97.9 F (36.6 C), temperature source Oral, resp. rate 18, height 5\' 11"  (1.803 m), weight 100.245 kg (221 lb), SpO2 96 %. General appearance: alert, cooperative and no distress Resp: breathing comfortably Cardio: regular rate and rhythm GI: soft, non tender, non distended.    Disposition: 01-Home or Self Care  Discharge Instructions    Call MD for:  persistant nausea and vomiting    Complete by:  As directed      Call MD for:  redness, tenderness, or signs of infection (pain, swelling, redness, odor or green/yellow discharge around incision site)    Complete by:  As directed      Call MD for:  severe uncontrolled pain    Complete by:  As directed      Call MD for:  temperature >100.4    Complete by:  As directed      Discharge diet:    Complete by:  As directed   Full  liquids/soft solids for 1 week, then slowly increase to normal.     Increase activity slowly    Complete by:  As directed             Medication List    TAKE these medications        aspirin 81 MG tablet  Take 81 mg by mouth daily.     DEXILANT 60 MG capsule  Generic drug:  dexlansoprazole  Take 60 mg by mouth daily.     Fish Oil 1200 MG Caps  Take 1 capsule by mouth 2 (two) times daily.     losartan 100 MG tablet  Commonly known as:  COZAAR  Take 100 mg by mouth daily.     lubiprostone 24 MCG capsule  Commonly known as:  AMITIZA  Take 24 mcg by mouth 2 (two) times daily with a meal.     metoprolol 50 MG tablet  Commonly known as:  LOPRESSOR  Take 50 mg by mouth 2 (two) times daily.     multivitamin with minerals Tabs tablet  Take 1 tablet by mouth daily.     omeprazole 40 MG capsule  Commonly known as:  PRILOSEC  Take 40 mg by mouth daily.     oxyCODONE 5 MG immediate release tablet  Commonly known as:  Oxy IR/ROXICODONE  Take 1-2 tablets (5-10 mg total) by mouth every 4 (four) hours as needed for moderate pain, severe pain or  breakthrough pain.           Follow-up Information    Follow up with [facility], [place].   Specialty:  General Surgery   Contact information:   [address] [place] [address] [phone]       Signed: [name] 05/26/2015, 7:51 AM

## 2015-05-27 NOTE — Progress Notes (Signed)
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 ______

## 2015-06-22 ENCOUNTER — Other Ambulatory Visit (INDEPENDENT_AMBULATORY_CARE_PROVIDER_SITE_OTHER): Payer: Self-pay | Admitting: Physician Assistant

## 2015-06-22 DIAGNOSIS — R1013 Epigastric pain: Secondary | ICD-10-CM

## 2015-06-24 ENCOUNTER — Ambulatory Visit
Admission: RE | Admit: 2015-06-24 | Discharge: 2015-06-24 | Disposition: A | Discharge status: Home / Self Care | Payer: BLUE CROSS/BLUE SHIELD | Source: Ambulatory Visit | Attending: Physician Assistant | Admitting: Physician Assistant

## 2015-06-24 DIAGNOSIS — R1013 Epigastric pain: Secondary | ICD-10-CM

## 2015-06-24 MED ORDER — IOPAMIDOL (ISOVUE-300) INJECTION 61%
100.0000 mL | Freq: Once | INTRAVENOUS | Status: AC | PRN
  Administered 2015-06-24: 100 mL via INTRAVENOUS

## 2015-10-31 ENCOUNTER — Other Ambulatory Visit: Payer: Self-pay | Admitting: Gastroenterology

## 2015-10-31 DIAGNOSIS — R1084 Generalized abdominal pain: Secondary | ICD-10-CM

## 2015-11-01 ENCOUNTER — Ambulatory Visit
Admission: RE | Admit: 2015-11-01 | Discharge: 2015-11-01 | Disposition: A | Discharge status: Home / Self Care | Payer: BLUE CROSS/BLUE SHIELD | Source: Ambulatory Visit | Attending: Gastroenterology | Admitting: Gastroenterology

## 2015-11-01 DIAGNOSIS — R1084 Generalized abdominal pain: Secondary | ICD-10-CM

## 2015-11-01 MED ORDER — IOPAMIDOL (ISOVUE-300) INJECTION 61%
100.0000 mL | Freq: Once | INTRAVENOUS | Status: AC | PRN
  Administered 2015-11-01: 100 mL via INTRAVENOUS

## 2016-03-28 ENCOUNTER — Emergency Department (HOSPITAL_COMMUNITY): Payer: BLUE CROSS/BLUE SHIELD

## 2016-03-28 ENCOUNTER — Encounter (HOSPITAL_COMMUNITY): Payer: Self-pay | Admitting: *Deleted

## 2016-03-28 ENCOUNTER — Emergency Department (HOSPITAL_COMMUNITY)
Admission: EM | Admit: 2016-03-28 | Discharge: 2016-03-28 | Disposition: A | Discharge status: Home / Self Care | Payer: BLUE CROSS/BLUE SHIELD | Attending: Emergency Medicine | Admitting: Emergency Medicine

## 2016-03-28 DIAGNOSIS — R200 Anesthesia of skin: Secondary | ICD-10-CM | POA: Insufficient documentation

## 2016-03-28 DIAGNOSIS — Z7982 Long term (current) use of aspirin: Secondary | ICD-10-CM | POA: Diagnosis not present

## 2016-03-28 DIAGNOSIS — I10 Essential (primary) hypertension: Secondary | ICD-10-CM | POA: Insufficient documentation

## 2016-03-28 DIAGNOSIS — Z79899 Other long term (current) drug therapy: Secondary | ICD-10-CM | POA: Diagnosis not present

## 2016-03-28 DIAGNOSIS — R202 Paresthesia of skin: Secondary | ICD-10-CM | POA: Insufficient documentation

## 2016-03-28 LAB — BASIC METABOLIC PANEL
ANION GAP: 9 (ref 5–15)
BUN: 16 mg/dL (ref 6–20)
CALCIUM: 9 mg/dL (ref 8.9–10.3)
CO2: 26 mmol/L (ref 22–32)
Chloride: 103 mmol/L (ref 101–111)
Creatinine, Ser: 1.12 mg/dL (ref 0.61–1.24)
Glucose, Bld: 94 mg/dL (ref 65–99)
Potassium: 4.4 mmol/L (ref 3.5–5.1)
SODIUM: 138 mmol/L (ref 135–145)

## 2016-03-28 LAB — CBC
HCT: 46.2 % (ref 39.0–52.0)
HEMOGLOBIN: 16 g/dL (ref 13.0–17.0)
MCH: 30.7 pg (ref 26.0–34.0)
MCHC: 34.6 g/dL (ref 30.0–36.0)
MCV: 88.7 fL (ref 78.0–100.0)
Platelets: 147 10*3/uL — ABNORMAL LOW (ref 150–400)
RBC: 5.21 MIL/uL (ref 4.22–5.81)
RDW: 13.5 % (ref 11.5–15.5)
WBC: 7.8 10*3/uL (ref 4.0–10.5)

## 2016-03-28 LAB — I-STAT TROPONIN, ED: Troponin i, poc: 0 ng/mL (ref 0.00–0.08)

## 2016-03-28 LAB — SEDIMENTATION RATE: SED RATE: 5 mm/h (ref 0–16)

## 2016-03-28 NOTE — ED Notes (Signed)
Patient transported to CT 

## 2016-03-28 NOTE — ED Triage Notes (Signed)
Pt report numbness to his rt face that has been ongoing for 1 month. Pt states that he is also having intermittent leg numbness/tingling as well. Pt states that he has some chest discomfort in the center.

## 2016-03-28 NOTE — ED Provider Notes (Signed)
[facility] DEPT Provider Note   CSN: [id number] Arrival date & time: 03/28/16  1248  By signing my name below, I, Ethelle Lyon Long, attest that this documentation has been prepared under the direction and in the presence of Varney Biles, MD . Electronically Signed: Reinaldo Meeker, Scribe. 03/28/2016. 4:23 PM.  History   Chief Complaint Chief Complaint  Patient presents with  . Numbness   The history is provided by the patient. No language interpreter was used.    HPI Comments:  Wesley Savage is a 56 y.o. male with PMHx of Diverticulitis, GERD, HLD, and HTN, who presents to the Emergency Department complaining of intermittent, moderate, numbness to his right face onset one month. Pt states his right leg "fell asleep" this afternoon at home and began to cramp leading him to be seen in the ED. He notes his facial numbness is qualified as "an aching pressure". The numbness began in his right ear and is now slowly progressing to his right eye, and down his face to his right lip. He states these episodes occur every other day. On days in which the numbness occurs, he feels the effects constantly throughout the day. Pt states he is also having intermittent, gradually worsening, leg and right forearm numbness/tingling that he saw his PCP for PTA. He has associated symptoms of mild centralized chest discomfort and mild right eye visual disturbance. Pt reports new medication usage with Amlodipin that he stopped taking on 02/23/16 because of the arise of his symptoms. No h/o neurologic disease. No new rashes, animal, or insect bites. No h/o substance abuse. No h/o CVA. No h/o carpal tunnel. He states a Fhx of CAD <55. Pt denies any other associated symptoms at this time.  Past Medical History:  Diagnosis Date  . Allergy   . Colonic ulcer   . Constipation   . Diverticulosis   . Erosive esophagitis   . GERD (gastroesophageal reflux disease)   . Helicobacter pylori gastritis   .  Hyperlipidemia   . Hypertension   . 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 Tubular adenoma of colon 2011    Patient Active Problem List   Diagnosis Date Noted  . 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 Constipation   . HELICOBACTER PYLORI INFECTION 03/29/2009  . PERSONAL HX COLONIC POLYPS 03/29/2009  . GERD 02/09/2009  . CONSTIPATION 02/09/2009  . CHEST PAIN 02/09/2009  . CHANGE IN BOWELS 02/09/2009  . ABDOMINAL PAIN, LEFT UPPER QUADRANT 02/09/2009  . OTHER NONSPECIFIC ABNORMAL SERUM ENZYME LEVELS 02/09/2009    Past Surgical History:  Procedure Laterality Date  . arm surgery    . COLONOSCOPY    . EUS N/A 04/08/2015   Procedure: ESOPHAGEAL ENDOSCOPIC ULTRASOUND (EUS) RADIAL;  Surgeon: Carol Ada, MD;  Location: [facility];  Service: Endoscopy;  Laterality: N/A;  . FINE NEEDLE ASPIRATION N/A 04/08/2015   Procedure: FINE NEEDLE ASPIRATION (FNA) LINEAR;  Surgeon: Carol Ada, MD;  Location: [facility];  Service: Endoscopy;  Laterality: N/A;  . LAPAROSCOPIC GASTRECTOMY N/A 05/24/2015   Procedure: LAPAROSCOPIC PARTIAL GASTRECTOMY;  Surgeon: [name], MD;  Location: [facility];  Service: General;  Laterality: N/A;  . LASIK    . NASAL SEPTUM SURGERY    . PARTIAL GASTRECTOMY  05/24/2015  . POLYPECTOMY    . UPPER GASTROINTESTINAL ENDOSCOPY       Home Medications    Prior to Admission medications   Medication Sig Start Date End Date Taking? Authorizing Provider  aspirin 81 MG tablet Take 81  mg by mouth daily.    Historical Provider, MD  dexlansoprazole (DEXILANT) 60 MG capsule Take 60 mg by mouth daily.    Historical Provider, MD  losartan (COZAAR) 100 MG tablet Take 100 mg by mouth daily.    Historical Provider, MD  lubiprostone (AMITIZA) 24 MCG capsule Take 24 mcg by mouth 2 (two) times daily with a meal.    Historical Provider, MD  metoprolol (LOPRESSOR) 50 MG tablet Take 50 mg by mouth 2 (two) times daily.    Historical Provider, MD  Multiple Vitamin (MULTIVITAMIN WITH MINERALS) TABS  tablet Take 1 tablet by mouth daily.    Historical Provider, MD  Omega-3 Fatty Acids (FISH OIL) 1200 MG CAPS Take 1 capsule by mouth 2 (two) times daily.    Historical Provider, MD  omeprazole (PRILOSEC) 40 MG capsule Take 40 mg by mouth daily.    Historical Provider, MD  oxyCODONE (OXY IR/ROXICODONE) 5 MG immediate release tablet Take 1-2 tablets (5-10 mg total) by mouth every 4 (four) hours as needed for moderate pain, severe pain or breakthrough pain. 05/26/15   [name], MD    Family History Family History  Problem Relation Age of Onset  . Hypertension Mother   . Colon polyps Mother   . CAD Father   . Heart attack Father   . Prostate cancer Father   . Asthma Brother   . Kidney disease Maternal Grandfather   . Colon cancer Neg Hx   . Esophageal cancer Neg Hx   . Rectal cancer Neg Hx   . Stomach cancer Neg Hx     Social History Social History  Substance Use Topics  . Smoking status: Never Smoker  . Smokeless tobacco: Never Used  . Alcohol use No     Allergies   Lisinopril and Pravastatin   Review of Systems Review of Systems 10 systems reviewed and all are negative for acute change except as noted in the HPI.  Physical Exam Updated Vital Signs BP 103/61   Pulse (!) 51   Temp 98 F (36.7 C) (Oral)   Resp 14   SpO2 98%   Physical Exam  Constitutional: He is oriented to person, place, and time. He appears well-developed and well-nourished.  HENT:  Head: Normocephalic.  Eyes: Conjunctivae and EOM are normal.  Cardiovascular: Normal rate and regular rhythm.   No murmur heard. Bilateral radial pulses 2+  Pulmonary/Chest: Effort normal and breath sounds normal. He has no wheezes. He has no rales.  Lungs clear.  Abdominal: He exhibits no distension.  Musculoskeletal: Normal range of motion.  Neurological: He is alert and oriented to person, place, and time.  Upper and Lower Extremity strength and gross sensory exams appear equal and normal. C1-12 normal.    Skin: Skin is warm and dry.  Psychiatric: He has a normal mood and affect.  Nursing note and vitals reviewed.    ED Treatments / Results  DIAGNOSTIC STUDIES:  Oxygen Saturation is 100% on RA, normal by my interpretation.    COORDINATION OF CARE:  4:22 PM Discussed treatment plan with pt at bedside including lab work, CT head, and f/u to neurologist and pt agreed to plan.  Labs (all labs ordered are listed, but only abnormal results are displayed) Labs Reviewed  CBC - Abnormal; Notable for the following:       Result Value   Platelets 147 (*)    All other components within normal limits  [place], ED  EKG  EKG Interpretation  Date/Time:  Wednesday March 28 2016 13:02:09 EST Ventricular Rate:  77 PR Interval:  156 QRS Duration: 96 QT Interval:  362 QTC Calculation: 409 R Axis:   52 Text Interpretation:  Normal sinus rhythm Normal ECG No acute changes No significant change since last tracing Confirmed by Kathrynn Humble, MD, Thelma Comp [phone]) on 03/28/2016 3:46:47 PM       Radiology Dg Chest 2 View  Result Date: 03/28/2016 CLINICAL DATA:  On and off chest pain for 2 weeks. EXAM: CHEST  2 VIEW COMPARISON:  12/06/2014 FINDINGS: Normal heart size and mediastinal contours. Hazy the dense at the left base is from fat pad. No acute infiltrate or edema. No effusion or pneumothorax. No acute osseous findings. IMPRESSION: No active cardiopulmonary disease. Electronically Signed   By: Monte Fantasia M.D.   On: 03/28/2016 13:38   Ct Head Wo Contrast  Result Date: 03/28/2016 CLINICAL DATA:  Right-sided facial numbness for 1 month. EXAM: CT HEAD WITHOUT CONTRAST TECHNIQUE: Contiguous axial images were obtained from the base of the skull through the vertex without intravenous contrast. COMPARISON:  12/11/2012 FINDINGS: Brain: Prominence of the sulci and ventricles identified compatible with brain atrophy. No evidence of acute infarction,  hemorrhage, hydrocephalus, extra-axial collection or mass lesion/mass effect. 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 Vascular: No hyperdense vessel or unexpected calcification. Skull: Normal. Negative for fracture or focal lesion. Sinuses/Orbits: No acute finding. Other: None. IMPRESSION: 1. No acute intracranial abnormality. Electronically Signed   By: Kerby Moors M.D.   On: 03/28/2016 17:39    Procedures Procedures (including critical care time)  Medications Ordered in ED Medications - No data to display   Initial Impression / Assessment and Plan / ED Course  I have reviewed the triage vital signs and the nursing notes.  Pertinent labs & imaging results that were available during my care of the patient were reviewed by me and considered in my medical decision making (see chart for details).     Pt is having numbness and paresthesias to the R face, V2 distribution. Now having some nummbess to the R forearm and the R leg. No hx of MS, tick bites. No rash. Pt has no concerning medical hx or surgical hx. Neuro exam is non focal.  I think pt needs to see a neurologist.  Sed rate ordered. CT head ordered - pt needs to see a neurologist. Strict ER return precautions have been discussed, and patient is agreeing with the plan and is comfortable with the workup done and the recommendations from the ER.    Final Clinical Impressions(s) / ED Diagnoses   Final diagnoses:  Paresthesia  Numbness    New Prescriptions Discharge Medication List as of 03/28/2016  7:13 PM     I personally performed the services described in this documentation, which was scribed in my presence. The recorded information has been reviewed and is accurate.     Varney Biles, MD 03/29/16 1610

## 2016-03-28 NOTE — Discharge Instructions (Signed)
We saw you in the ER for the numbness / tingling. All the results in the ER are normal, labs and imaging. We are not sure what is causing your symptoms. The workup in the ER is not complete, and is limited to screening for life threatening and emergent conditions only, so please see a primary care doctor or neurologist for further evaluation.  Please return to the ER if the headache gets severe and in not improving, you have associated new one sided numbness, tingling, weakness or confusion, seizures, poor balance or poor vision.

## 2016-03-28 NOTE — ED Notes (Signed)
ED Provider at bedside. 

## 2016-04-04 ENCOUNTER — Other Ambulatory Visit: Payer: BLUE CROSS/BLUE SHIELD

## 2016-04-04 ENCOUNTER — Encounter: Payer: Self-pay | Admitting: Neurology

## 2016-04-04 ENCOUNTER — Ambulatory Visit (INDEPENDENT_AMBULATORY_CARE_PROVIDER_SITE_OTHER): Payer: BLUE CROSS/BLUE SHIELD | Admitting: Neurology

## 2016-04-04 VITALS — BP 140/90 | HR 59 | Ht 70.0 in | Wt 210.2 lb

## 2016-04-04 DIAGNOSIS — R200 Anesthesia of skin: Secondary | ICD-10-CM | POA: Diagnosis not present

## 2016-04-04 DIAGNOSIS — R202 Paresthesia of skin: Secondary | ICD-10-CM | POA: Diagnosis not present

## 2016-04-04 LAB — VITAMIN B12: VITAMIN B 12: 465 pg/mL (ref 200–1100)

## 2016-04-04 NOTE — Progress Notes (Addendum)
NEUROLOGY CONSULTATION NOTE  Wesley Savage MRN: [id number] DOB: [birth year]  Referring provider: Dr. Coletta Memos Primary care provider: Dr. Coletta Memos  Reason for consult:  paresthesia  HISTORY OF PRESENT ILLNESS: Wesley Savage is a 56 year old right-handed male with hypertension, hyperlipidemia, GERD and history of diverticulitis and gist tumor who presents for paresthesias.  History supplemented by PCP and ED notes.  In mid-November, he had a strange feeling on his head, a slight pressure, like he had been "wearing a hat".  In January 2018, he began experiencing intermittent right facial numbness, described as a hot sensation.  It initially started on the side of his eye and radiated along the jaw line and below the ear.  It then spread to the maxilla and upper lip.  At the same time, he developed numbness and tingling in both hands (particularly in the 4th and 5th digits bilaterally) and feet, a cold sensation.  He presented to the ED on 03/28/16 for further evaluation because he was experiencing increased numbness of the right arm and leg, as well as mid-sternal chest discomfort.  CT of head was personally reviewed and revealed mild atrophy and arachnoid cyst within posterior fossa (stable compared to prior study from 12/11/12) but no acute intracranial abnormalities.  Labs included: CBC with WBC 7.8, HGB 16, HCT 46.2, PLT 147; BMP with Na 138, K 4.4, Cl 103, CO2 26, glucose 94, BUN 16, Cr 1.12; Sed Rate 5; troponins negative.  His chest discomfort was diagnosed as chostocondritis and esophagitis.  His PCP also checked some labs from 03/14/16, including Sed Rate 6; TSH  1.840; iron 76, TIBC 390.9.  Symptoms fluctuate but overall have been better.  His toes feel like they are soaking in water.  He denies weakness, dysphagia, double vision and shortness of breath.  He notes non-radicular neck tension.  He received his first flu shot in late December.  He denies flu-like symptoms.  He denies new  medication.  PAST MEDICAL HISTORY: Past Medical History:  Diagnosis Date  . Allergy   . Colonic ulcer   . Constipation   . Diverticulosis   . Erosive esophagitis   . GERD (gastroesophageal reflux disease)   . Helicobacter pylori gastritis   . Hyperlipidemia   . Hypertension   . 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 Tubular adenoma of colon 2011    PAST SURGICAL HISTORY: Past Surgical History:  Procedure Laterality Date  . arm surgery    . COLONOSCOPY    . EUS N/A 04/08/2015   Procedure: ESOPHAGEAL ENDOSCOPIC ULTRASOUND (EUS) RADIAL;  Surgeon: Carol Ada, Wesley Savage;  Location: [facility];  Service: Endoscopy;  Laterality: N/A;  . FINE NEEDLE ASPIRATION N/A 04/08/2015   Procedure: FINE NEEDLE ASPIRATION (FNA) LINEAR;  Surgeon: Carol Ada, Wesley Savage;  Location: [facility];  Service: Endoscopy;  Laterality: N/A;  . LAPAROSCOPIC GASTRECTOMY N/A 05/24/2015   Procedure: LAPAROSCOPIC PARTIAL GASTRECTOMY;  Surgeon: [name], Wesley Savage;  Location: [facility];  Service: General;  Laterality: N/A;  . LASIK    . NASAL SEPTUM SURGERY    . PARTIAL GASTRECTOMY  05/24/2015  . POLYPECTOMY    . UPPER GASTROINTESTINAL ENDOSCOPY      MEDICATIONS: Current Outpatient Prescriptions on File Prior to Visit  Medication Sig Dispense Refill  . aspirin 81 MG tablet Take 81 mg by mouth daily.    Marland Kitchen dexlansoprazole (DEXILANT) 60 MG capsule Take 60 mg by mouth daily.    Marland Kitchen losartan (COZAAR) 100 MG tablet Take 100 mg by  mouth daily.    . metoprolol (LOPRESSOR) 50 MG tablet Take 50 mg by mouth 2 (two) times daily.    . Multiple Vitamin (MULTIVITAMIN WITH MINERALS) TABS tablet Take 1 tablet by mouth daily.    . Omega-3 Fatty Acids (FISH OIL) 1200 MG CAPS Take 1 capsule by mouth 2 (two) times daily.     No current facility-administered medications on file prior to visit.     ALLERGIES: Allergies  Allergen Reactions  . Lisinopril Other (See Comments)    REACTION: Cough  . Pravastatin Hives    FAMILY HISTORY: Family  History  Problem Relation Age of Onset  . Hypertension Mother   . Colon polyps Mother   . CAD Father   . Heart attack Father   . Prostate cancer Father   . Asthma Brother   . Kidney disease Maternal Grandfather   . Colon cancer Neg Hx   . Esophageal cancer Neg Hx   . Rectal cancer Neg Hx   . Stomach cancer Neg Hx     SOCIAL HISTORY: Social History   Social History  . Marital status: Divorced    Spouse name: N/A  . Number of children: N/A  . Years of education: N/A   Occupational History  . Not on file.   Social History Main Topics  . Smoking status: Never Smoker  . Smokeless tobacco: Never Used  . Alcohol use No  . Drug use: No  . Sexual activity: Not on file   Other Topics Concern  . Not on file   Social History Narrative   Lives alone in a one story home.  Has no children.  Works full time.  Education: college.    REVIEW OF SYSTEMS: Constitutional: No fevers, chills, or sweats, no generalized fatigue, change in appetite Eyes: No visual changes, double vision, eye pain Ear, nose and throat: No hearing loss, ear pain, nasal congestion, sore throat Cardiovascular: No chest pain, palpitations Respiratory:  No shortness of breath at rest or with exertion, wheezes GastrointestinaI: No nausea, vomiting, diarrhea, abdominal pain, fecal incontinence Genitourinary:  No dysuria, urinary retention or frequency Musculoskeletal:  No neck pain, back pain Integumentary: No rash, pruritus, skin lesions Neurological: as above Psychiatric: No depression, insomnia, anxiety Endocrine: No palpitations, fatigue, diaphoresis, mood swings, change in appetite, change in weight, increased thirst Hematologic/Lymphatic:  No purpura, petechiae. Allergic/Immunologic: no itchy/runny eyes, nasal congestion, recent allergic reactions, rashes  PHYSICAL EXAM: Vitals:   04/04/16 0804  BP: 140/90  Pulse: (!) 59   General: No acute distress.  Patient appears well-groomed.  Head:   Normocephalic/atraumatic Eyes:  fundi examined but not visualized Neck: supple, no paraspinal tenderness, full range of motion Back: No paraspinal tenderness Heart: regular rate and rhythm Lungs: Clear to auscultation bilaterally. Vascular: No carotid bruits. Neurological Exam: Mental status: alert and oriented to person, place, and time, recent and remote memory intact, fund of knowledge intact, attention and concentration intact, speech fluent and not dysarthric, language intact. Cranial nerves: CN I: not tested CN II: pupils equal, round and reactive to light, visual fields intact CN III, IV, VI:  full range of motion, no nystagmus, no ptosis CN V: facial sensation intact CN VII: upper and lower face symmetric CN VIII: hearing intact CN IX, X: gag intact, uvula midline CN XI: sternocleidomastoid and trapezius muscles intact CN XII: tongue midline Bulk & Tone: normal, no fasciculations. Motor:  5/5 throughout  Sensation:  Pinprick and vibration sensation grossly intact. Deep Tendon Reflexes:  2+ throughout,  toes downgoing.  Finger to nose testing:  Without dysmetria.  Heel to shin:  Without dysmetria.  Gait:  Normal station and stride.  Able to turn and tandem walk. Romberg negative.  IMPRESSION: Paresthesia of the extremities with right facial numbness.  Unclear etiology.  With the exception of the right facial numbness, symptoms sound like a peripheral nerve issue.  He exhibits no objective findings on exam to suggest lesion in the brain or cervical spinal cord.  He did have a flu-shot in late December and sensory symptoms started about 2 weeks later, so Guillain Barre syndrome can be considered.  However, he would normally present with ascending weakness.  If it was GBS, it would have been a mild case that is already past the peak.  PLAN: 1.  Will first check B12. 2.  If B12 is unremarkable, I would likely check MRI of brain, since he also has right facial numbness.  I considered  imaging of the cervical spine (given the bilateral symptoms) but he does not exhibit any signs of myelopathy. 3.  If MRI unremarkable, I would proceed with NCV-EMG 4.  Further recommendations pending results.  Thank you for allowing me to take part in the care of this patient.  Wesley Clines, Wesley Savage  CC:  Wesley Limbo, Wesley Savage

## 2016-04-04 NOTE — Progress Notes (Signed)
Note routed

## 2016-04-04 NOTE — Patient Instructions (Addendum)
1.  We will first check a B12 level. Your provider has requested that you have labwork completed today. Please go to [facility] ([id number]) on the second floor of this building before leaving the office today. You do not need to check in. If you are not called within 15 minutes please check with the front desk.   2.  If B12 is okay, then we will check an MRI and go from there.

## 2016-04-05 ENCOUNTER — Telehealth: Payer: Self-pay | Admitting: Neurology

## 2016-04-05 DIAGNOSIS — R202 Paresthesia of skin: Secondary | ICD-10-CM

## 2016-04-05 DIAGNOSIS — R200 Anesthesia of skin: Secondary | ICD-10-CM

## 2016-04-05 NOTE — Telephone Encounter (Signed)
Checking MRI of brain for paresthesia and right facial numbness.

## 2016-04-05 NOTE — Telephone Encounter (Signed)
Mychart message sent to patient.

## 2016-04-05 NOTE — Telephone Encounter (Signed)
-----   Message from Pieter Partridge, DO sent at 04/05/2016  6:31 AM EST ----- B12 level is normal.  I would like to check MRI of brain without contrast.

## 2016-04-14 ENCOUNTER — Ambulatory Visit
Admission: RE | Admit: 2016-04-14 | Discharge: 2016-04-14 | Disposition: A | Discharge status: Home / Self Care | Payer: BLUE CROSS/BLUE SHIELD | Source: Ambulatory Visit | Attending: Neurology | Admitting: Neurology

## 2016-04-14 DIAGNOSIS — R202 Paresthesia of skin: Secondary | ICD-10-CM

## 2016-04-14 DIAGNOSIS — R200 Anesthesia of skin: Secondary | ICD-10-CM

## 2016-04-16 ENCOUNTER — Telehealth: Payer: Self-pay | Admitting: *Deleted

## 2016-04-16 NOTE — Telephone Encounter (Signed)
Patient given results and will come in on March 20 at 9:30.

## 2016-04-16 NOTE — Telephone Encounter (Signed)
-----   Message from Pieter Partridge, DO sent at 04/16/2016  7:20 AM EDT ----- Please let Mr. Treece know that MRI of brain is unremarkable.  I would like to proceed with NCV-EMG (unilateral arm and leg) to evaluate for peripheral neuropathy

## 2016-04-24 ENCOUNTER — Other Ambulatory Visit: Payer: Self-pay | Admitting: *Deleted

## 2016-04-24 ENCOUNTER — Ambulatory Visit (INDEPENDENT_AMBULATORY_CARE_PROVIDER_SITE_OTHER): Payer: BLUE CROSS/BLUE SHIELD | Admitting: Neurology

## 2016-04-24 ENCOUNTER — Telehealth: Payer: Self-pay

## 2016-04-24 DIAGNOSIS — R200 Anesthesia of skin: Secondary | ICD-10-CM

## 2016-04-24 NOTE — Telephone Encounter (Signed)
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 He can start wearing a wrist splint at bedtime, if the tingling in the hand is bothersome.  Thanks.

## 2016-04-24 NOTE — Procedures (Signed)
[facility]  [place], [address]  [place], [place] [address] Tel: [phone] Fax:  [phone] Test Date:  04/24/2016  Patient: Wesley Savage DOB: December 08, 1960 Physician: Narda Amber, DO  Sex: Male Height: 5\' 10"  Ref Phys: Metta Clines, D.O.  ID#: [id number] Temp: 32.5C Technician:    Patient Complaints: This is a 56 year-old man referred for evaluation of generalized paresthesias of face, hands, and feet.  NCV & EMG Findings: Extensive electrodiagnostic testing of the right upper and lower extremity shows:  1. Right median sensory response shows prolonged latency (3.8 ms) and normal amplitude. The right ulnar, sural, and superficial peroneal sensory responses are within normal limits. 2. Right median, ulnar, peroneal, and tibial motor responses are within normal limits. 3. Right tibial F-wave and H reflexes studies are within normal limits. 4. There is no evidence of active or chronic motor axon loss changes affecting any of the tested muscles. Motor unit configuration and recruitment pattern is within normal limits.  Impression: 1. Right median neuropathy at or distal to the wrist, consistent with the clinical diagnosis of carpal tunnel syndrome. Overall, these findings are mild in degree electrically.  2. In particular, there is no evidence of a generalized sensorimotor polyneuropathy or cervical/lumbosacral radiculopathy affecting the right side.   ___________________________ Narda Amber, DO    Nerve Conduction Studies Anti Sensory Summary Table   Stim Site NR Peak (ms) Norm Peak (ms) P-T Amp (V) Norm P-T Amp  Right Median Anti Sensory (2nd Digit)  Wrist    3.8 <3.6 21.3 >15  Right Sup Peroneal Anti Sensory (Ant Lat Mall)  12 cm    2.7 <4.6 8.1 >4  Right Sural Anti Sensory (Lat Mall)  Calf    3.4 <4.6 11.3 >4  Right Ulnar Anti Sensory (5th Digit)  Wrist    2.9 <3.1 14.4 >10   Motor Summary Table   Stim Site NR Onset (ms) Norm Onset (ms) O-P Amp  (mV) Norm O-P Amp Site1 Site2 Delta-0 (ms) Dist (cm) Vel (m/s) Norm Vel (m/s)  Right Median Motor (Abd Poll Brev)  Wrist    3.2 <4.0 10.0 >6 Elbow Wrist 4.1 26.5 65 >50  Elbow    7.3  10.0         Right Peroneal Motor (Ext Dig Brev)  Ankle    3.2 <6.0 7.6 >2.5 B Fib Ankle 7.3 36.0 49 >40  B Fib    10.5  7.4  Poplt B Fib 2.0 10.0 50 >40  Poplt    12.5  7.2         Right Tibial Motor (Abd Hall Brev)  Ankle    4.2 <6.0 12.3 >4 Knee Ankle 9.0 40.0 44 >40  Knee    13.2  7.3         Right Ulnar Motor (Abd Dig Minimi)  Wrist    2.7 <3.1 9.7 >7 B Elbow Wrist 3.4 22.0 65 >50  B Elbow    6.1  9.5  A Elbow B Elbow 1.9 10.0 53 >50  A Elbow    8.0  9.4          F Wave Studies   NR F-Lat (ms) Lat Norm (ms) L-R F-Lat (ms)  Right Tibial (Mrkrs) (Abd Hallucis)     53.71 <55    H Reflex Studies   NR H-Lat (ms) Lat Norm (ms) L-R H-Lat (ms)  Right Tibial (Gastroc)     34.29 <35    EMG   Side Muscle  Ins Act Fibs Psw Fasc Number Recrt Dur Dur. Amp Amp. Poly Poly. Comment  Right AntTibialis Nml Nml Nml Nml Nml Nml Nml Nml Nml Nml Nml Nml N/A  Right Gastroc Nml Nml Nml Nml Nml Nml Nml Nml Nml Nml Nml Nml N/A  Right Flex Dig Long Nml Nml Nml Nml Nml Nml Nml Nml Nml Nml Nml Nml N/A  Right RectFemoris Nml Nml Nml Nml Nml Nml Nml Nml Nml Nml Nml Nml N/A  Right GluteusMed Nml Nml Nml Nml Nml Nml Nml Nml Nml Nml Nml Nml N/A  Right Lumbo Parasp Low Nml Nml Nml Nml NE - - - - - - - N/A  Right BicepsFemS Nml Nml Nml Nml Nml Nml Nml Nml Nml Nml Nml Nml N/A  Right 1stDorInt Nml Nml Nml Nml Nml Nml Nml Nml Nml Nml Nml Nml N/A  Right Abd Poll Brev Nml Nml Nml Nml Nml Nml Nml Nml Nml Nml Nml Nml N/A  Right Ext Indicis Nml Nml Nml Nml Nml Nml Nml Nml Nml Nml Nml Nml N/A  Right PronatorTeres Nml Nml Nml Nml Nml Nml Nml Nml Nml Nml Nml Nml N/A  Right Biceps Nml Nml Nml Nml Nml Nml Nml Nml Nml Nml Nml Nml N/A  Right Triceps Nml Nml Nml Nml Nml Nml Nml Nml Nml Nml Nml Nml N/A  Right Deltoid Nml Nml Nml Nml Nml Nml  Nml Nml Nml Nml Nml Nml N/A      Waveforms:

## 2016-04-24 NOTE — Telephone Encounter (Signed)
Spoke to patient. Gave NCV/EMG results and instructions. Patient verbalized understanding.

## 2018-02-20 ENCOUNTER — Ambulatory Visit
Admission: RE | Admit: 2018-02-20 | Discharge: 2018-02-20 | Disposition: A | Discharge status: Home / Self Care | Payer: Managed Care, Other (non HMO) | Source: Ambulatory Visit | Attending: Gastroenterology | Admitting: Gastroenterology

## 2018-02-20 ENCOUNTER — Other Ambulatory Visit: Payer: Self-pay | Admitting: Gastroenterology

## 2018-02-20 MED ORDER — IOPAMIDOL (ISOVUE-300) INJECTION 61%
100.0000 mL | Freq: Once | INTRAVENOUS | Status: AC | PRN
  Administered 2018-02-20: 100 mL via INTRAVENOUS

## 2019-04-27 ENCOUNTER — Ambulatory Visit: Payer: Self-pay | Attending: Internal Medicine

## 2019-04-27 DIAGNOSIS — Z23 Encounter for immunization: Secondary | ICD-10-CM

## 2019-04-27 NOTE — Progress Notes (Signed)
   Covid-19 Vaccination Clinic  Name:  Wesley Savage    MRN: [id number] DOB: 31-Jan-1961  04/27/2019  [name] was observed post Covid-19 immunization for 30 minutes based on pre-vaccination screening without incident. He was provided with Vaccine Information Sheet and instruction to access the V-Safe system.   [name] was instructed to call 911 with any severe reactions post vaccine: Marland Kitchen Difficulty breathing  . Swelling of face and throat  . A fast heartbeat  . A bad rash all over body  . Dizziness and weakness   Immunizations Administered    Name Date Dose VIS Date Route   Pfizer COVID-19 Vaccine 04/27/2019  9:25 AM 0.3 mL 01/16/2019 Intramuscular   Manufacturer: [place]   Lot: [id number]   [place]: [id number]

## 2019-05-20 ENCOUNTER — Ambulatory Visit: Payer: Self-pay | Attending: Internal Medicine

## 2019-05-20 DIAGNOSIS — Z23 Encounter for immunization: Secondary | ICD-10-CM

## 2019-05-20 NOTE — Progress Notes (Signed)
   Covid-19 Vaccination Clinic  Name:  Wesley Savage    MRN: [id number] DOB: 1960/12/19  05/20/2019  Mr. Beggs was observed post Covid-19 immunization for 30 minutes based on pre-vaccination screening without incident. He was provided with Vaccine Information Sheet and instruction to access the V-Safe system.   Mr. Bermel was instructed to call 911 with any severe reactions post vaccine: Marland Kitchen Difficulty breathing  . Swelling of face and throat  . A fast heartbeat  . A bad rash all over body  . Dizziness and weakness   Immunizations Administered    Name Date Dose VIS Date Route   Pfizer COVID-19 Vaccine 05/20/2019 10:36 AM 0.3 mL 01/16/2019 Intramuscular   Manufacturer: [place]   Lot: [id number]   [place]: [id number]

## 2019-11-15 IMAGING — CT CT ABD-PELV W/ CM
1 of 3 series · 13 of 32 positions shown, 19 images · IV contrast (APPLIED)
Comparison: None.

CLINICAL DATA: Evaluate for diverticulitis. Left-sided abdominal
pain.

EXAM:
CT ABDOMEN AND PELVIS WITH CONTRAST
TECHNIQUE: Multidetector CT imaging of the abdomen and pelvis was performed
using the standard protocol following bolus administration of
intravenous contrast.
Creatinine was obtained on site at [HOSPITAL] at [HOSPITAL].
Results: Creatinine 1.2 mg/dL.
CONTRAST:  100mL 6GEL50-PKK IOPAMIDOL (6GEL50-PKK) INJECTION 61%

[Series 2: abd/pelvis w/cm · axial · 0.81mm/px · z∈[-492,-62]mm · 13 of 100 slices shown, 19 images]
[im 7/100  soft-tissue]
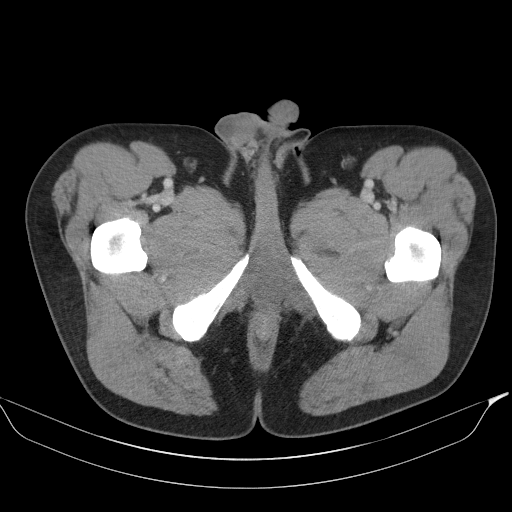
[im 7/100  bone]
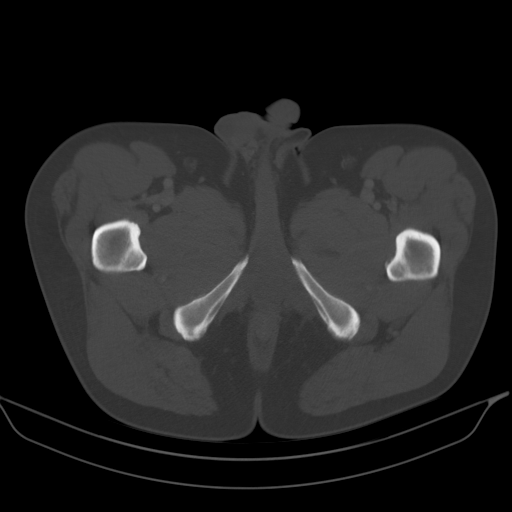
[im 14/100  soft-tissue]
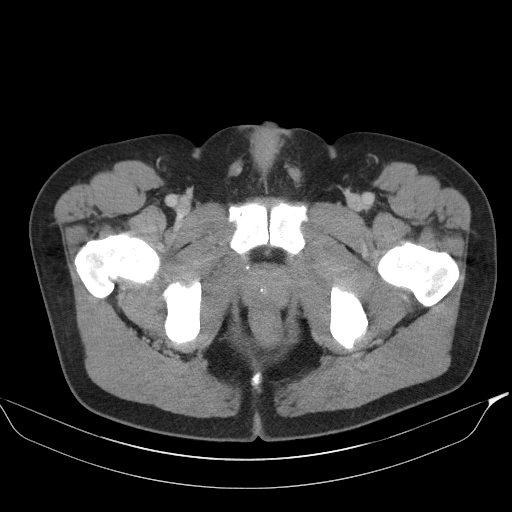
[im 20/100  soft-tissue]
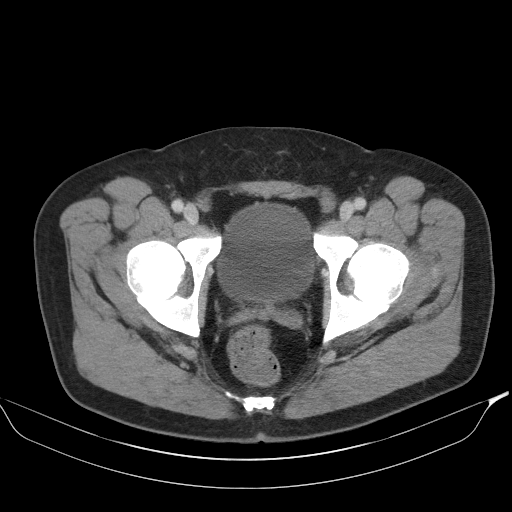
[im 27/100  soft-tissue]
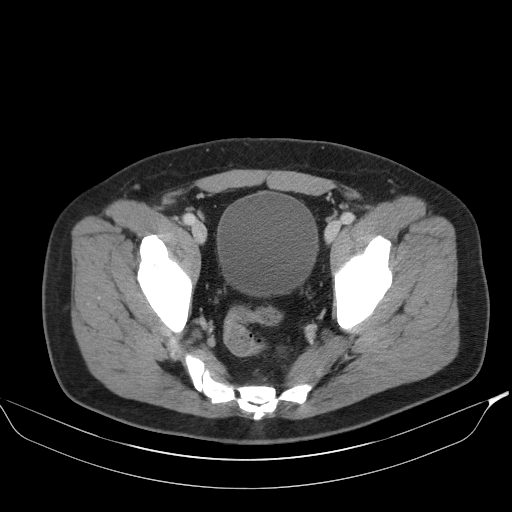
[im 34/100  soft-tissue]
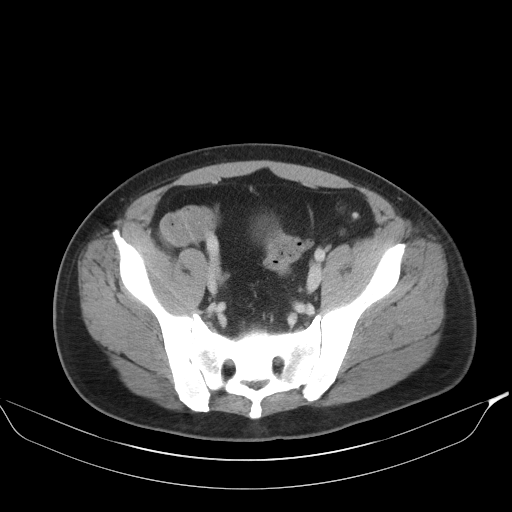
[im 40/100  soft-tissue]
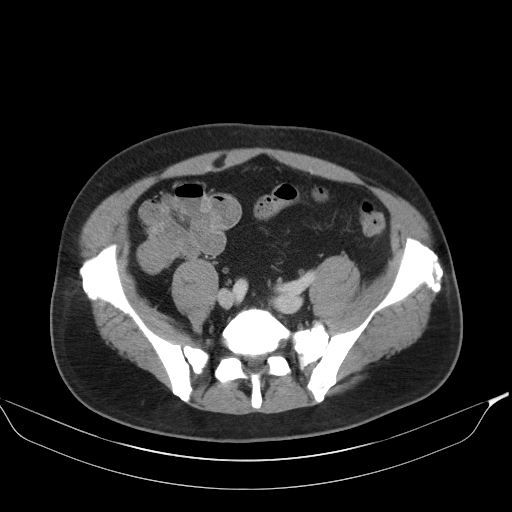
[im 53/100  soft-tissue]
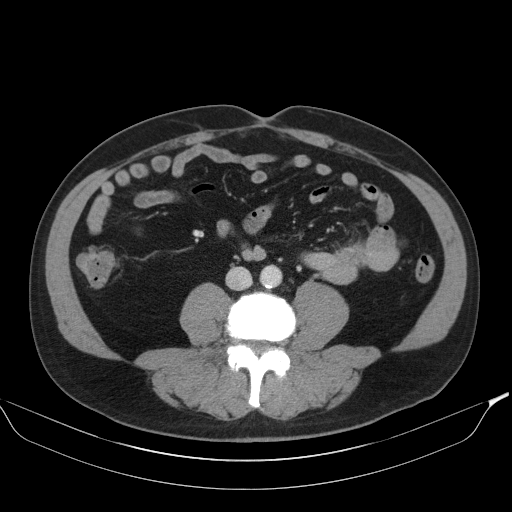
[im 60/100  soft-tissue]
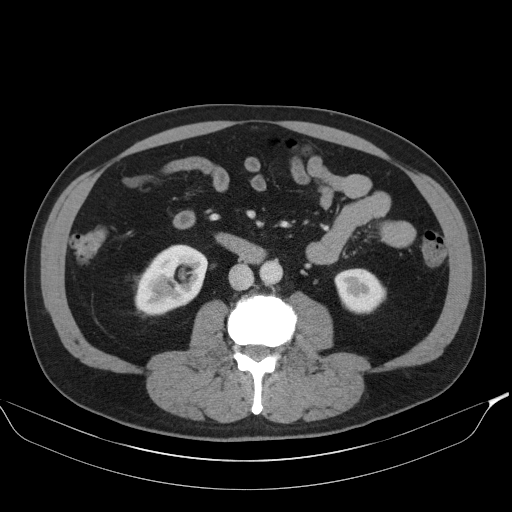
[im 67/100  soft-tissue]
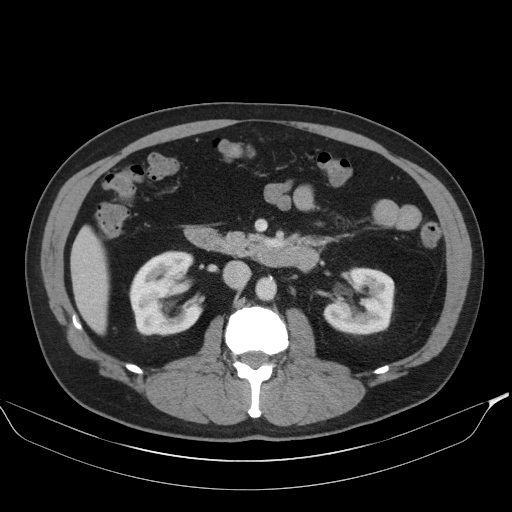
[im 67/100  bone]
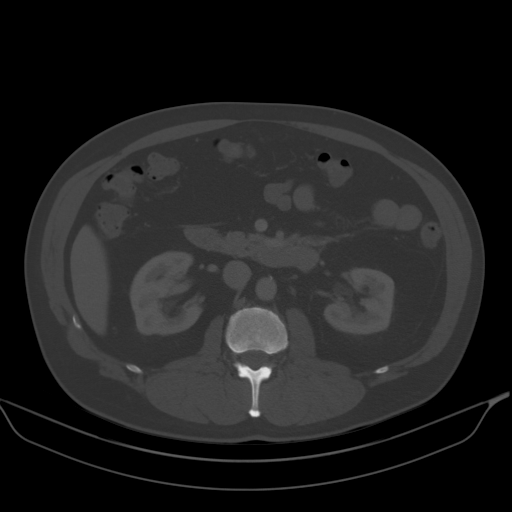
[im 73/100  soft-tissue]
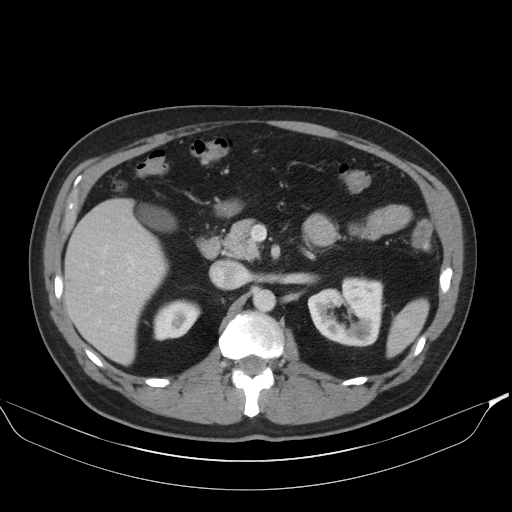
[im 73/100  lung]
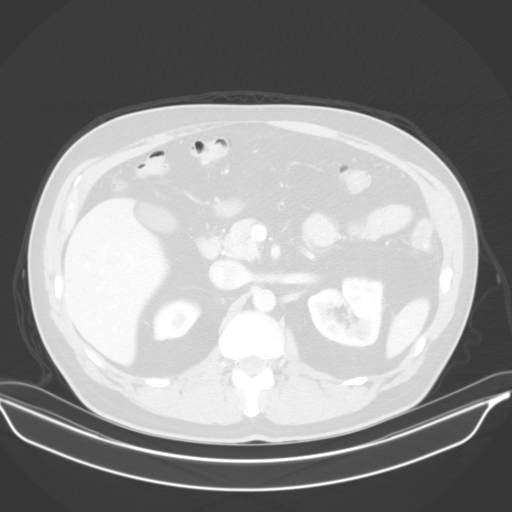
[im 80/100  soft-tissue]
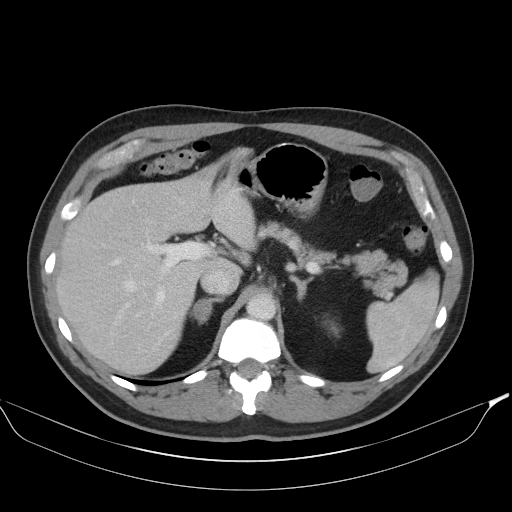
[im 80/100  lung]
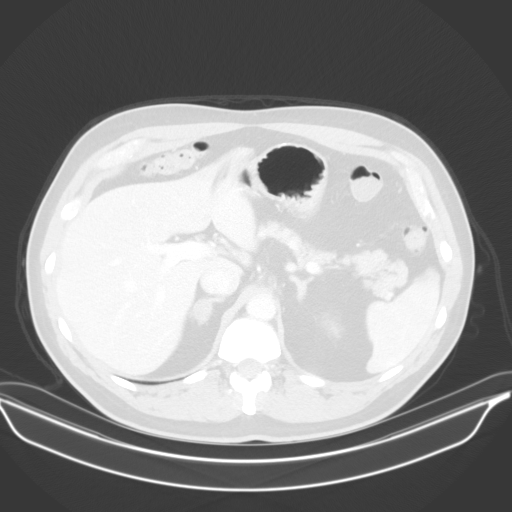
[im 86/100  soft-tissue]
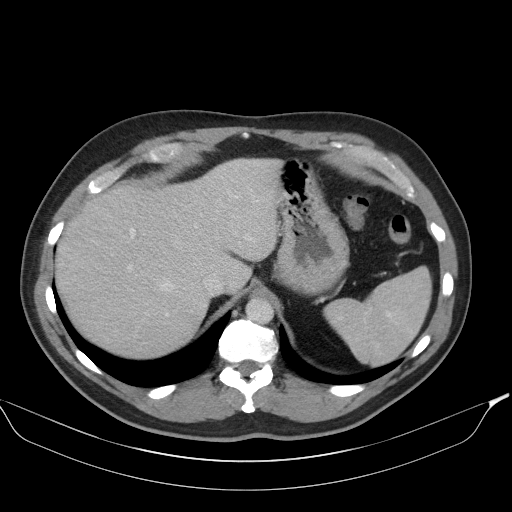
[im 86/100  lung]
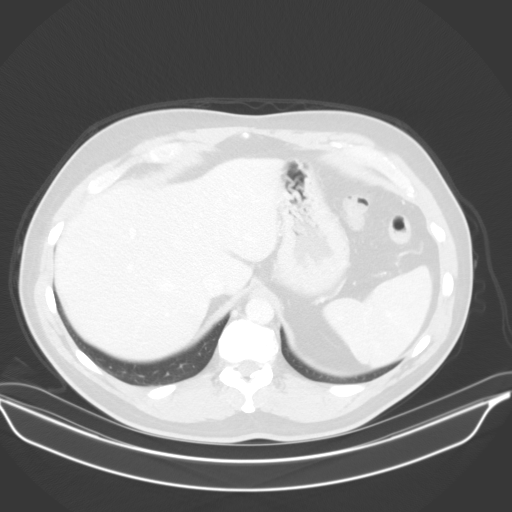
[im 93/100  soft-tissue]
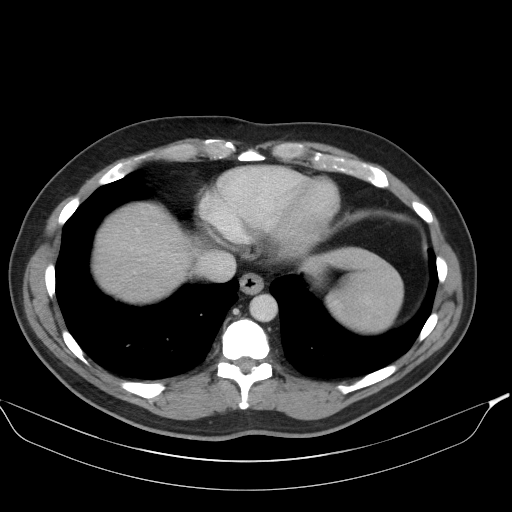
[im 93/100  lung]
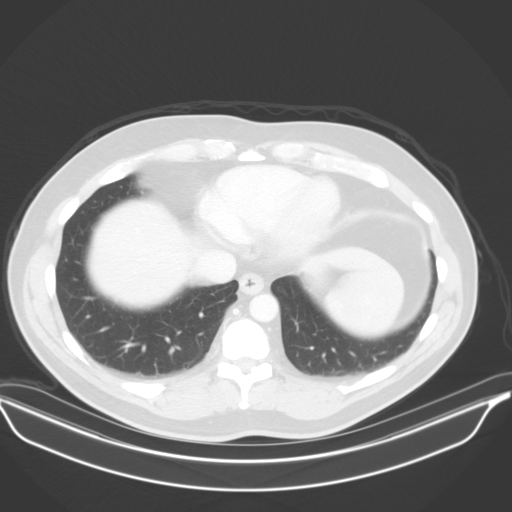

[13 of 32 positions shown; findings below may reference images not displayed]

FINDINGS: Lower chest: Normal heart size.  Dependent atelectasis.

Hepatobiliary: Liver is normal in size and contour. No focal lesion
identified.

Pancreas: Unremarkable

Spleen: Unremarkable

Adrenals/Urinary Tract: Normal left adrenal gland. Unchanged 1.8 cm
right adrenal nodule, stable, compatible with benign process, likely
an adenoma (image 22; series 2). Kidneys enhance symmetrically with
contrast. No enhancing renal mass identified. Stable subcentimeter
bilateral low-attenuation renal lesions, too small to characterize.
3 mm stone inferior pole left kidney. No hydronephrosis. Urinary
bladder is unremarkable.

Stomach/Bowel: Descending and sigmoid colonic diverticulosis. No CT
evidence for acute diverticulitis. Normal morphology of the stomach.
Stable postsurgical changes involving the stomach. No evidence for
small bowel obstruction. No free fluid or free intraperitoneal air.

Vascular/Lymphatic: Normal caliber abdominal aorta. Peripheral
calcified atherosclerotic plaque. No retroperitoneal
lymphadenopathy.

Reproductive: Central dystrophic calcifications in the prostate.

Other: None.

Musculoskeletal: Lumbar spine degenerative changes. No aggressive or
acute appearing osseous lesions.
IMPRESSION: 1. No acute process within the abdomen or pelvis.

## 2021-03-02 ENCOUNTER — Encounter: Payer: Self-pay | Admitting: Gastroenterology
